# Patient Record
Sex: Female | Born: 1955 | Race: Black or African American | Hispanic: No | Marital: Single | Smoking: Never smoker
Health system: Southern US, Community
[De-identification: ages and names within clinical notes are randomized; demographics above are authoritative.]

## PROBLEM LIST (undated history)

## (undated) DIAGNOSIS — I10 Essential (primary) hypertension: Secondary | ICD-10-CM

## (undated) DIAGNOSIS — G44209 Tension-type headache, unspecified, not intractable: Secondary | ICD-10-CM

## (undated) DIAGNOSIS — G43909 Migraine, unspecified, not intractable, without status migrainosus: Secondary | ICD-10-CM

## (undated) DIAGNOSIS — D649 Anemia, unspecified: Secondary | ICD-10-CM

## (undated) DIAGNOSIS — C801 Malignant (primary) neoplasm, unspecified: Secondary | ICD-10-CM

## (undated) DIAGNOSIS — C189 Malignant neoplasm of colon, unspecified: Secondary | ICD-10-CM

## (undated) DIAGNOSIS — K219 Gastro-esophageal reflux disease without esophagitis: Secondary | ICD-10-CM

## (undated) HISTORY — PX: ABDOMINAL HYSTERECTOMY: SHX81

## (undated) HISTORY — PX: COLON SURGERY: SHX602

## (undated) HISTORY — PX: CHOLECYSTECTOMY: SHX55

---

## 2008-11-25 ENCOUNTER — Emergency Department (HOSPITAL_BASED_OUTPATIENT_CLINIC_OR_DEPARTMENT_OTHER): Admission: EM | Admit: 2008-11-25 | Discharge: 2008-11-25 | Payer: Self-pay | Admitting: Emergency Medicine

## 2008-11-27 ENCOUNTER — Emergency Department (HOSPITAL_BASED_OUTPATIENT_CLINIC_OR_DEPARTMENT_OTHER): Admission: EM | Admit: 2008-11-27 | Discharge: 2008-11-27 | Payer: Self-pay | Admitting: Emergency Medicine

## 2008-11-27 ENCOUNTER — Ambulatory Visit: Payer: Self-pay | Admitting: Radiology

## 2008-12-04 ENCOUNTER — Emergency Department (HOSPITAL_BASED_OUTPATIENT_CLINIC_OR_DEPARTMENT_OTHER): Admission: EM | Admit: 2008-12-04 | Discharge: 2008-12-04 | Payer: Self-pay | Admitting: Emergency Medicine

## 2008-12-23 ENCOUNTER — Ambulatory Visit: Payer: Self-pay | Admitting: Radiology

## 2008-12-23 ENCOUNTER — Emergency Department (HOSPITAL_BASED_OUTPATIENT_CLINIC_OR_DEPARTMENT_OTHER): Admission: EM | Admit: 2008-12-23 | Discharge: 2008-12-23 | Payer: Self-pay | Admitting: Emergency Medicine

## 2009-05-18 ENCOUNTER — Emergency Department (HOSPITAL_BASED_OUTPATIENT_CLINIC_OR_DEPARTMENT_OTHER): Admission: EM | Admit: 2009-05-18 | Discharge: 2009-05-18 | Payer: Self-pay | Admitting: Emergency Medicine

## 2009-06-14 ENCOUNTER — Emergency Department (HOSPITAL_BASED_OUTPATIENT_CLINIC_OR_DEPARTMENT_OTHER): Admission: EM | Admit: 2009-06-14 | Discharge: 2009-06-14 | Payer: Self-pay | Admitting: Emergency Medicine

## 2009-06-22 ENCOUNTER — Emergency Department (HOSPITAL_BASED_OUTPATIENT_CLINIC_OR_DEPARTMENT_OTHER): Admission: EM | Admit: 2009-06-22 | Discharge: 2009-06-22 | Payer: Self-pay | Admitting: Emergency Medicine

## 2009-08-16 ENCOUNTER — Emergency Department (HOSPITAL_BASED_OUTPATIENT_CLINIC_OR_DEPARTMENT_OTHER): Admission: EM | Admit: 2009-08-16 | Discharge: 2009-08-16 | Payer: Self-pay | Admitting: Emergency Medicine

## 2009-09-11 ENCOUNTER — Emergency Department (HOSPITAL_BASED_OUTPATIENT_CLINIC_OR_DEPARTMENT_OTHER): Admission: EM | Admit: 2009-09-11 | Discharge: 2009-09-11 | Payer: Self-pay | Admitting: Emergency Medicine

## 2010-01-13 ENCOUNTER — Emergency Department (HOSPITAL_BASED_OUTPATIENT_CLINIC_OR_DEPARTMENT_OTHER): Admission: EM | Admit: 2010-01-13 | Discharge: 2010-01-13 | Payer: Self-pay | Admitting: Emergency Medicine

## 2010-03-06 ENCOUNTER — Emergency Department (HOSPITAL_BASED_OUTPATIENT_CLINIC_OR_DEPARTMENT_OTHER)
Admission: EM | Admit: 2010-03-06 | Discharge: 2010-03-06 | Payer: Self-pay | Source: Home / Self Care | Admitting: Emergency Medicine

## 2010-04-14 ENCOUNTER — Emergency Department (HOSPITAL_BASED_OUTPATIENT_CLINIC_OR_DEPARTMENT_OTHER)
Admission: EM | Admit: 2010-04-14 | Discharge: 2010-04-14 | Disposition: A | Discharge status: Home / Self Care | Payer: Self-pay | Attending: Emergency Medicine | Admitting: Emergency Medicine

## 2010-04-14 DIAGNOSIS — G43909 Migraine, unspecified, not intractable, without status migrainosus: Secondary | ICD-10-CM | POA: Insufficient documentation

## 2010-04-14 DIAGNOSIS — I10 Essential (primary) hypertension: Secondary | ICD-10-CM | POA: Insufficient documentation

## 2010-06-10 LAB — BASIC METABOLIC PANEL
CO2: 25 mEq/L (ref 19–32)
Chloride: 106 mEq/L (ref 96–112)
Creatinine, Ser: 0.6 mg/dL (ref 0.4–1.2)
Potassium: 3.7 mEq/L (ref 3.5–5.1)
Sodium: 142 mEq/L (ref 135–145)

## 2010-06-10 LAB — DIFFERENTIAL
Basophils Absolute: 0.2 10*3/uL — ABNORMAL HIGH (ref 0.0–0.1)
Eosinophils Absolute: 0 10*3/uL (ref 0.0–0.7)
Monocytes Relative: 7 % (ref 3–12)
Neutro Abs: 5.4 10*3/uL (ref 1.7–7.7)

## 2010-06-10 LAB — CBC
HCT: 41.9 % (ref 36.0–46.0)
MCHC: 32.7 g/dL (ref 30.0–36.0)
MCV: 82.4 fL (ref 78.0–100.0)

## 2010-06-10 LAB — URINALYSIS, ROUTINE W REFLEX MICROSCOPIC
Bilirubin Urine: NEGATIVE
Hgb urine dipstick: NEGATIVE
Nitrite: NEGATIVE
Urobilinogen, UA: 0.2 mg/dL (ref 0.0–1.0)
pH: 5.5 (ref 5.0–8.0)

## 2010-06-10 LAB — URINE CULTURE: Colony Count: 15000

## 2010-06-10 LAB — PROTIME-INR: Prothrombin Time: 12.6 seconds (ref 11.6–15.2)

## 2010-06-11 LAB — DIFFERENTIAL
Basophils Absolute: 0 10*3/uL (ref 0.0–0.1)
Basophils Relative: 0 % (ref 0–1)
Eosinophils Absolute: 0.1 10*3/uL (ref 0.0–0.7)
Eosinophils Relative: 2 % (ref 0–5)
Lymphocytes Relative: 13 % (ref 12–46)
Lymphs Abs: 1 10*3/uL (ref 0.7–4.0)
Monocytes Absolute: 0.5 10*3/uL (ref 0.1–1.0)
Monocytes Relative: 6 % (ref 3–12)
Neutrophils Relative %: 79 % — ABNORMAL HIGH (ref 43–77)

## 2010-06-11 LAB — POCT CARDIAC MARKERS: CKMB, poc: <1 ng/mL — ABNORMAL LOW (ref 1.0–8.0)

## 2010-06-11 LAB — CBC
HCT: 41.8 % (ref 36.0–46.0)
Hemoglobin: 13.8 g/dL (ref 12.0–15.0)
Platelets: 276 10*3/uL (ref 150–400)
RBC: 5.09 MIL/uL (ref 3.87–5.11)

## 2010-06-11 LAB — BASIC METABOLIC PANEL
Calcium: 9.2 mg/dL (ref 8.4–10.5)
Chloride: 107 mEq/L (ref 96–112)
GFR calc Af Amer: >60 mL/min (ref >60)
Glucose, Bld: 106 mg/dL — ABNORMAL HIGH (ref 70–99)
Sodium: 144 mEq/L (ref 135–145)

## 2010-06-28 ENCOUNTER — Emergency Department (HOSPITAL_BASED_OUTPATIENT_CLINIC_OR_DEPARTMENT_OTHER)
Admission: EM | Admit: 2010-06-28 | Discharge: 2010-06-28 | Disposition: A | Discharge status: Home / Self Care | Payer: Self-pay | Attending: Emergency Medicine | Admitting: Emergency Medicine

## 2010-06-28 DIAGNOSIS — Z79899 Other long term (current) drug therapy: Secondary | ICD-10-CM | POA: Insufficient documentation

## 2010-06-28 DIAGNOSIS — I10 Essential (primary) hypertension: Secondary | ICD-10-CM | POA: Insufficient documentation

## 2010-06-28 DIAGNOSIS — K219 Gastro-esophageal reflux disease without esophagitis: Secondary | ICD-10-CM | POA: Insufficient documentation

## 2010-06-28 DIAGNOSIS — G43909 Migraine, unspecified, not intractable, without status migrainosus: Secondary | ICD-10-CM | POA: Insufficient documentation

## 2010-09-11 ENCOUNTER — Encounter: Payer: Self-pay | Admitting: Emergency Medicine

## 2010-09-11 ENCOUNTER — Emergency Department (HOSPITAL_BASED_OUTPATIENT_CLINIC_OR_DEPARTMENT_OTHER)
Admission: EM | Admit: 2010-09-11 | Discharge: 2010-09-11 | Disposition: A | Discharge status: Home / Self Care | Payer: Self-pay | Attending: Emergency Medicine | Admitting: Emergency Medicine

## 2010-09-11 DIAGNOSIS — I10 Essential (primary) hypertension: Secondary | ICD-10-CM | POA: Insufficient documentation

## 2010-09-11 DIAGNOSIS — K219 Gastro-esophageal reflux disease without esophagitis: Secondary | ICD-10-CM | POA: Insufficient documentation

## 2010-09-11 DIAGNOSIS — R51 Headache: Secondary | ICD-10-CM | POA: Insufficient documentation

## 2010-09-11 HISTORY — DX: Essential (primary) hypertension: I10

## 2010-09-11 HISTORY — DX: Gastro-esophageal reflux disease without esophagitis: K21.9

## 2010-09-11 HISTORY — DX: Anemia, unspecified: D64.9

## 2010-09-11 HISTORY — DX: Malignant (primary) neoplasm, unspecified: C80.1

## 2010-09-11 MED ORDER — HYDROMORPHONE HCL 2 MG/ML IJ SOLN
INTRAMUSCULAR | Status: AC
  Filled 2010-09-11: qty 1

## 2010-09-11 MED ORDER — PROMETHAZINE HCL 25 MG/ML IJ SOLN
INTRAMUSCULAR | Status: AC
  Filled 2010-09-11: qty 1

## 2010-09-11 MED ORDER — PROMETHAZINE HCL 25 MG/ML IJ SOLN: 12.5000 mg | Freq: Once | INTRAMUSCULAR | Status: DC

## 2010-09-11 MED ORDER — HYDROMORPHONE HCL 2 MG/ML IJ SOLN: 2.0000 mg | Freq: Once | INTRAMUSCULAR | Status: DC

## 2010-09-11 NOTE — ED Notes (Signed)
Headache started x 3 days with nausea  Reports having hx of same...ambulatory....moves all extremeties

## 2010-09-11 NOTE — ED Provider Notes (Signed)
History     Chief Complaint  Patient presents with  . Migraine   Patient is a 55 y.o. female presenting with migraine. The history is provided by the patient.  Migraine This is a new problem. The current episode started yesterday. The problem occurs constantly. The problem has been unchanged. Associated symptoms include headaches and nausea. The symptoms are aggravated by nothing. She has tried acetaminophen for the symptoms. The treatment provided no relief.    Past Medical History  Diagnosis Date  . Cancer   . Hypertension   . GERD (gastroesophageal reflux disease)   . Anemia     Past Surgical History  Procedure Date  . Cholecystectomy   . Abdominal hysterectomy   . Colon surgery     No family history on file.  History  Substance Use Topics  . Smoking status: Not on file  . Smokeless tobacco: Not on file  . Alcohol Use:     OB History    Grav Para Term Preterm Abortions TAB SAB Ect Mult Living                  Review of Systems  Gastrointestinal: Positive for nausea.  Neurological: Positive for headaches.  All other systems reviewed and are negative.    Physical Exam  BP 147/79  Pulse 97  Temp(Src) 98.1 F (36.7 C) (Oral)  Resp 20  SpO2 100%  Physical Exam  Constitutional: She is oriented to person, place, and time. She appears well-developed.  HENT:  Head: Normocephalic.  Right Ear: External ear normal.  Left Ear: External ear normal.  Mouth/Throat: Oropharynx is clear and moist.  Eyes: Conjunctivae and EOM are normal. Pupils are equal, round, and reactive to light.  Neck: Normal range of motion. Neck supple.  Cardiovascular: Normal rate, regular rhythm and normal heart sounds.   Pulmonary/Chest: Effort normal.  Abdominal: Soft.  Musculoskeletal: Normal range of motion.  Neurological: She is alert and oriented to person, place, and time. No cranial nerve deficit.  Skin: Skin is warm and dry.  Psychiatric: She has a normal mood and affect.     ED Course  Procedures   Pt has a history of colon cancer,  Pt is having a colonoscopy on Monday.  (Pt reports Dilaudid and Phenergan have worked well in the past)  I advised I would like to try other medications however given pending scope I will treat with Dilaudid and Phenergan)    Langston Masker, PA 09/11/10 1255  Langston Masker, [place] 09/11/10 1257  Langston Masker, [place] 09/11/10 1324   I performed a history and physical examination of Kallie Depolo and discussed her management with Langston Masker.  I agree with the history, physical, assessment, and plan of care, with the following exceptions: None  I was present for the following procedures: None Time Spent in Critical Care of the patient: None Time spent in discussions with the patient and family  Shania Bjelland Corlis Leak, MD 09/11/10 1329

## 2010-09-12 ENCOUNTER — Encounter (HOSPITAL_BASED_OUTPATIENT_CLINIC_OR_DEPARTMENT_OTHER): Payer: Self-pay | Admitting: Emergency Medicine

## 2010-10-25 ENCOUNTER — Encounter (HOSPITAL_BASED_OUTPATIENT_CLINIC_OR_DEPARTMENT_OTHER): Payer: Self-pay | Admitting: *Deleted

## 2010-10-25 ENCOUNTER — Emergency Department (HOSPITAL_BASED_OUTPATIENT_CLINIC_OR_DEPARTMENT_OTHER)
Admission: EM | Admit: 2010-10-25 | Discharge: 2010-10-25 | Disposition: A | Discharge status: Home / Self Care | Payer: Self-pay | Attending: Emergency Medicine | Admitting: Emergency Medicine

## 2010-10-25 DIAGNOSIS — R51 Headache: Secondary | ICD-10-CM | POA: Insufficient documentation

## 2010-10-25 DIAGNOSIS — R110 Nausea: Secondary | ICD-10-CM | POA: Insufficient documentation

## 2010-10-25 DIAGNOSIS — I10 Essential (primary) hypertension: Secondary | ICD-10-CM | POA: Insufficient documentation

## 2010-10-25 DIAGNOSIS — K219 Gastro-esophageal reflux disease without esophagitis: Secondary | ICD-10-CM | POA: Insufficient documentation

## 2010-10-25 MED ORDER — HYDROMORPHONE HCL 1 MG/ML IJ SOLN
INTRAMUSCULAR | Status: AC
  Filled 2010-10-25: qty 1

## 2010-10-25 MED ORDER — IBUPROFEN 800 MG PO TABS: 800.0000 mg | ORAL_TABLET | Freq: Once | ORAL | Status: DC

## 2010-10-25 MED ORDER — DIPHENHYDRAMINE HCL 50 MG/ML IJ SOLN
25.0000 mg | Freq: Once | INTRAMUSCULAR | Status: AC
  Administered 2010-10-25: 25 mg via INTRAVENOUS
  Filled 2010-10-25: qty 1

## 2010-10-25 MED ORDER — HYDROMORPHONE HCL 1 MG/ML IJ SOLN
2.0000 mg | Freq: Once | INTRAMUSCULAR | Status: AC
  Administered 2010-10-25: 2 mg via INTRAVENOUS
  Filled 2010-10-25: qty 1

## 2010-10-25 MED ORDER — PROMETHAZINE HCL 25 MG/ML IJ SOLN
12.5000 mg | Freq: Once | INTRAMUSCULAR | Status: AC
  Administered 2010-10-25: 12.5 mg via INTRAVENOUS
  Filled 2010-10-25: qty 1

## 2010-10-25 MED ORDER — MORPHINE SULFATE 4 MG/ML IJ SOLN: 8.0000 mg | Freq: Once | INTRAMUSCULAR | Status: DC

## 2010-10-25 NOTE — ED Provider Notes (Signed)
History     CSN: [id number] Arrival date & time: 10/25/2010 11:37 AM  Chief Complaint  Patient presents with  . Headache   Patient is a 55 y.o. female presenting with headaches. The history is provided by the patient.  Headache  This is a recurrent problem. The current episode started yesterday. The problem occurs constantly. The problem has not changed since onset.The pain is located in the bilateral region. The quality of the pain is described as dull and throbbing. The pain is at a severity of 8/10. The pain is moderate. The pain does not radiate. Associated symptoms include nausea. Pertinent negatives include no fever, no chest pressure, no near-syncope, no orthopnea, no palpitations and no shortness of breath. She has tried nothing for the symptoms. The treatment provided moderate relief.  Pt complains of a headache since yesterday.  No relief with fiorcet  Past Medical History  Diagnosis Date  . Cancer   . Hypertension   . GERD (gastroesophageal reflux disease)   . Anemia   . GERD (gastroesophageal reflux disease)     Past Surgical History  Procedure Date  . Cholecystectomy   . Abdominal hysterectomy   . Colon surgery     History reviewed. No pertinent family history.  History  Substance Use Topics  . Smoking status: Never Smoker   . Smokeless tobacco: Not on file  . Alcohol Use: No    OB History    Grav Para Term Preterm Abortions TAB SAB Ect Mult Living                  Review of Systems  Constitutional: Negative for fever.  Respiratory: Negative for shortness of breath.   Cardiovascular: Negative for palpitations, orthopnea and near-syncope.  Gastrointestinal: Positive for nausea.  Neurological: Positive for headaches.  All other systems reviewed and are negative.    Physical Exam  BP 148/87  Pulse 92  Temp(Src) 98.1 F (36.7 C) (Oral)  Resp 18  Ht 5\' 3"  (1.6 m)  Wt 215 lb (97.523 kg)  BMI 38.09 kg/m2  SpO2 98%  Physical Exam  Nursing note  and vitals reviewed. Constitutional: She is oriented to person, place, and time. She appears well-developed and well-nourished.  HENT:  Head: Normocephalic and atraumatic.  Eyes: Conjunctivae and EOM are normal. Pupils are equal, round, and reactive to light.  Neck: Normal range of motion. Neck supple.  Cardiovascular: Normal rate and regular rhythm.   Pulmonary/Chest: Effort normal and breath sounds normal.  Abdominal: Soft.  Musculoskeletal: Normal range of motion.  Neurological: She is alert and oriented to person, place, and time. She has normal reflexes.  Skin: Skin is warm and dry.  Psychiatric: She has a normal mood and affect.    ED Course  Procedures  MDM  Pt given Dilaudid,  Benadryl and Phenergan.  Pt advised to followup with her Md as scheduled.      Langston Masker, [place] 10/25/10 1318

## 2010-10-25 NOTE — ED Notes (Signed)
Triage and assessment reviewed by myself and agree with [facility] RN findings.

## 2010-10-25 NOTE — ED Notes (Addendum)
Patient states she woke up Friday morning (3days ago) with a headache and it has not gone away and pain is words at times. Patient states she has had nausea and denies vomiting and fever.

## 2010-10-25 NOTE — ED Notes (Signed)
Correction to medication entry at 1302- Benadryl, Dilaudid and Phenergan ad ministered IM per order.

## 2010-10-25 NOTE — ED Provider Notes (Signed)
Medical screening examination/treatment/procedure(s) were performed by non-physician practitioner and as supervising physician I was immediately available for consultation/collaboration.   Lyanne Co, MD 10/25/10 1318

## 2010-11-07 ENCOUNTER — Encounter (HOSPITAL_BASED_OUTPATIENT_CLINIC_OR_DEPARTMENT_OTHER): Payer: Self-pay | Admitting: *Deleted

## 2010-11-07 ENCOUNTER — Emergency Department (HOSPITAL_BASED_OUTPATIENT_CLINIC_OR_DEPARTMENT_OTHER)
Admission: EM | Admit: 2010-11-07 | Discharge: 2010-11-07 | Disposition: A | Discharge status: Home / Self Care | Payer: Self-pay | Attending: Emergency Medicine | Admitting: Emergency Medicine

## 2010-11-07 DIAGNOSIS — K219 Gastro-esophageal reflux disease without esophagitis: Secondary | ICD-10-CM | POA: Insufficient documentation

## 2010-11-07 DIAGNOSIS — R51 Headache: Secondary | ICD-10-CM | POA: Insufficient documentation

## 2010-11-07 MED ORDER — DIPHENHYDRAMINE HCL 50 MG/ML IJ SOLN
25.0000 mg | Freq: Once | INTRAMUSCULAR | Status: DC
  Filled 2010-11-07: qty 1

## 2010-11-07 MED ORDER — KETOROLAC TROMETHAMINE 60 MG/2ML IM SOLN
60.0000 mg | Freq: Once | INTRAMUSCULAR | Status: AC
  Administered 2010-11-07: 60 mg via INTRAMUSCULAR
  Filled 2010-11-07: qty 2

## 2010-11-07 MED ORDER — DIPHENHYDRAMINE HCL 50 MG/ML IJ SOLN
25.0000 mg | Freq: Once | INTRAMUSCULAR | Status: AC
  Administered 2010-11-07: 25 mg via INTRAMUSCULAR

## 2010-11-07 MED ORDER — METOCLOPRAMIDE HCL 5 MG/ML IJ SOLN
10.0000 mg | Freq: Once | INTRAMUSCULAR | Status: AC
  Administered 2010-11-07: 10 mg via INTRAMUSCULAR
  Filled 2010-11-07: qty 2

## 2010-11-07 NOTE — ED Notes (Signed)
Pt d/c home- no Rx given- pt alert, ambulatory with steady gait

## 2010-11-07 NOTE — ED Provider Notes (Signed)
History     CSN: [id number] Arrival date & time: 11/07/2010  1:27 PM  Chief Complaint  Patient presents with  . Headache   HPI Comments: Pt states that she has history of similar symptoms:pt states that she is usually gets dilaudid and phenergan  Patient is a 55 y.o. female presenting with headaches. The history is provided by the patient. No language interpreter was used.  Headache  This is a recurrent problem. The current episode started 2 days ago. The problem occurs constantly. The problem has not changed since onset.The pain is located in the frontal region. The quality of the pain is described as throbbing. The pain is severe. The pain does not radiate. Associated symptoms include nausea. Pertinent negatives include no fever, no near-syncope, no shortness of breath and no vomiting. She has tried acetaminophen for the symptoms. The treatment provided no relief.    Past Medical History  Diagnosis Date  . Cancer   . Hypertension   . GERD (gastroesophageal reflux disease)   . Anemia   . GERD (gastroesophageal reflux disease)     Past Surgical History  Procedure Date  . Cholecystectomy   . Abdominal hysterectomy   . Colon surgery     History reviewed. No pertinent family history.  History  Substance Use Topics  . Smoking status: Never Smoker   . Smokeless tobacco: Not on file  . Alcohol Use: No    OB History    Grav Para Term Preterm Abortions TAB SAB Ect Mult Living                  Review of Systems  Constitutional: Negative for fever.  Respiratory: Negative for shortness of breath.   Cardiovascular: Negative for near-syncope.  Gastrointestinal: Positive for nausea. Negative for vomiting.  Neurological: Positive for headaches.  All other systems reviewed and are negative.    Physical Exam  BP 201/109  Pulse 104  Temp(Src) 98.2 F (36.8 C) (Oral)  Resp 20  Ht 5\' 3"  (1.6 m)  Wt 214 lb (97.07 kg)  BMI 37.91 kg/m2  SpO2 100%  Physical Exam  Vitals  reviewed. Constitutional: She is oriented to person, place, and time. She appears well-developed and well-nourished.  HENT:  Head: Normocephalic and atraumatic.  Right Ear: External ear normal.  Left Ear: External ear normal.  Eyes: Pupils are equal, round, and reactive to light.  Cardiovascular: Normal rate and regular rhythm.   Pulmonary/Chest: Effort normal and breath sounds normal.  Neurological: She is alert and oriented to person, place, and time.  Skin: Skin is warm and dry.  Psychiatric: She has a normal mood and affect.    ED Course  Procedures  MDM 2:00 PM Discussed with pt that will give headache cocktail 3:12 PM Offered pt some imitrex or phenergan and pt is refusing, she would like dilaudid only or she can go home:discussed with pt that as discussed previously no narcotics to be given     Teressa Lower, NP 11/07/10 1513

## 2010-11-07 NOTE — ED Notes (Signed)
Pt states she was seen here two weeks ago for same. This particular headache started on Thursday. +nausea. Denies other s/s.

## 2010-11-09 NOTE — ED Provider Notes (Signed)
Medical screening examination/treatment/procedure(s) were performed by non-physician practitioner and as supervising physician I was immediately available for consultation/collaboration.   Nat Christen, MD 11/09/10 [phone]

## 2011-03-06 ENCOUNTER — Encounter (HOSPITAL_BASED_OUTPATIENT_CLINIC_OR_DEPARTMENT_OTHER): Payer: Self-pay

## 2011-03-06 ENCOUNTER — Emergency Department (HOSPITAL_BASED_OUTPATIENT_CLINIC_OR_DEPARTMENT_OTHER)
Admission: EM | Admit: 2011-03-06 | Discharge: 2011-03-06 | Disposition: A | Discharge status: Home / Self Care | Payer: Self-pay | Attending: Emergency Medicine | Admitting: Emergency Medicine

## 2011-03-06 DIAGNOSIS — I10 Essential (primary) hypertension: Secondary | ICD-10-CM | POA: Insufficient documentation

## 2011-03-06 DIAGNOSIS — G43909 Migraine, unspecified, not intractable, without status migrainosus: Secondary | ICD-10-CM | POA: Insufficient documentation

## 2011-03-06 DIAGNOSIS — K219 Gastro-esophageal reflux disease without esophagitis: Secondary | ICD-10-CM | POA: Insufficient documentation

## 2011-03-06 HISTORY — DX: Tension-type headache, unspecified, not intractable: G44.209

## 2011-03-06 MED ORDER — ONDANSETRON 8 MG PO TBDP
ORAL_TABLET | ORAL | Status: AC
  Administered 2011-03-06: 8 mg via ORAL
  Filled 2011-03-06: qty 1

## 2011-03-06 MED ORDER — MORPHINE SULFATE 2 MG/ML IJ SOLN
2.0000 mg | Freq: Once | INTRAMUSCULAR | Status: AC
  Administered 2011-03-06: 2 mg via INTRAMUSCULAR
  Filled 2011-03-06: qty 1

## 2011-03-06 MED ORDER — ONDANSETRON 8 MG PO TBDP
8.0000 mg | ORAL_TABLET | Freq: Once | ORAL | Status: AC
  Administered 2011-03-06: 8 mg via ORAL

## 2011-03-06 NOTE — ED Provider Notes (Signed)
History    This chart was scribed for Hilario Quarry, MD, MD by Smitty Pluck. The patient was seen in room MH03 and the patient's care was started at 4:22PM.   CSN: [id number]  Arrival date & time 03/06/11  1420   First MD Initiated Contact with Patient 03/06/11 1613      Chief Complaint  Patient presents with  . Headache    (Consider location/radiation/quality/duration/timing/severity/associated sxs/prior treatment) The history is provided by the patient.   Victoria Sullivan is a 55 y.o. female who presents to the Emergency Department complaining of moderate headache onset 5 days ago. Pain has been constant since onset. Pt has a hx of migraine headaches that start in the left side of head. Pt has nausea, fever and neck pain but denies vomiting today.  Pt has colon cancer (diagnosed 2 yrs ago). PCP is Dr. Debroah Loop. Pt has had abdominal hysterectomy and colon surgery.   Past Medical History  Diagnosis Date  . Cancer   . Hypertension   . GERD (gastroesophageal reflux disease)   . Anemia   . GERD (gastroesophageal reflux disease)   . Tension headache     Past Surgical History  Procedure Date  . Cholecystectomy   . Abdominal hysterectomy   . Colon surgery     No family history on file.  History  Substance Use Topics  . Smoking status: Never Smoker   . Smokeless tobacco: Not on file  . Alcohol Use: No    OB History    Grav Para Term Preterm Abortions TAB SAB Ect Mult Living                  Review of Systems  All other systems reviewed and are negative.   10 Systems reviewed and are negative for acute change except as noted in the HPI.  Allergies  Nsaids  Home Medications   Current Outpatient Rx  Name Route Sig Dispense Refill  . BUTALBITAL-APAP-CAFFEINE 50-325-40 MG PO TABS Oral Take 2 tablets by mouth 2 (two) times daily as needed. For migraines    . ESOMEPRAZOLE MAGNESIUM 40 MG PO CPDR Oral Take 40 mg by mouth daily before breakfast.      . FERROUS  SULFATE 325 (65 FE) MG PO TABS Oral Take 325 mg by mouth 3 (three) times daily.     . NEBIVOLOL HCL 20 MG PO TABS Oral Take 1 tablet by mouth daily.     Marland Kitchen OLMESARTAN-AMLODIPINE-HCTZ 40-10-25 MG PO TABS Oral Take 1 tablet by mouth daily.       BP 123/69  Pulse 90  Temp(Src) 97.9 F (36.6 C) (Oral)  Resp 16  Ht 5\' 3"  (1.6 m)  Wt 218 lb (98.884 kg)  BMI 38.62 kg/m2  SpO2 98%  Physical Exam  Nursing note and vitals reviewed. Constitutional: She is oriented to person, place, and time. She appears well-developed and well-nourished. No distress.  HENT:  Head: Normocephalic and atraumatic.  Eyes: EOM are normal. Pupils are equal, round, and reactive to light.  Neck: Normal range of motion. Neck supple. No tracheal deviation present.  Cardiovascular: Normal rate, regular rhythm and normal heart sounds.   Pulmonary/Chest: Effort normal. No respiratory distress.  Abdominal: Soft. She exhibits no distension. There is no tenderness.  Musculoskeletal: Normal range of motion.  Neurological: She is alert and oriented to person, place, and time. She has normal reflexes.  Skin: Skin is warm and dry.  Psychiatric: She has a normal mood and affect. Her behavior  is normal.    ED Course  Procedures (including critical care time)  DIAGNOSTIC STUDIES: Oxygen Saturation is 98% on room air, normal by my interpretation.    COORDINATION OF CARE:    Labs Reviewed - No data to display No results found.   No diagnosis found.    MDM     I personally performed the services described in this documentation, which was scribed in my presence. The recorded information has been reviewed and considered.    Hilario Quarry, MD 03/08/11 [phone]

## 2011-03-06 NOTE — ED Notes (Signed)
HA started 12/25

## 2011-03-08 ENCOUNTER — Emergency Department (HOSPITAL_COMMUNITY)
Admission: EM | Admit: 2011-03-08 | Discharge: 2011-03-08 | Disposition: A | Discharge status: Home / Self Care | Payer: Self-pay | Attending: Emergency Medicine | Admitting: Emergency Medicine

## 2011-03-08 ENCOUNTER — Encounter (HOSPITAL_COMMUNITY): Payer: Self-pay | Admitting: Emergency Medicine

## 2011-03-08 DIAGNOSIS — Z859 Personal history of malignant neoplasm, unspecified: Secondary | ICD-10-CM | POA: Insufficient documentation

## 2011-03-08 DIAGNOSIS — R630 Anorexia: Secondary | ICD-10-CM | POA: Insufficient documentation

## 2011-03-08 DIAGNOSIS — F430 Acute stress reaction: Secondary | ICD-10-CM | POA: Insufficient documentation

## 2011-03-08 DIAGNOSIS — H53149 Visual discomfort, unspecified: Secondary | ICD-10-CM | POA: Insufficient documentation

## 2011-03-08 DIAGNOSIS — Z79899 Other long term (current) drug therapy: Secondary | ICD-10-CM | POA: Insufficient documentation

## 2011-03-08 DIAGNOSIS — I10 Essential (primary) hypertension: Secondary | ICD-10-CM | POA: Insufficient documentation

## 2011-03-08 DIAGNOSIS — G43909 Migraine, unspecified, not intractable, without status migrainosus: Secondary | ICD-10-CM | POA: Insufficient documentation

## 2011-03-08 DIAGNOSIS — R110 Nausea: Secondary | ICD-10-CM | POA: Insufficient documentation

## 2011-03-08 DIAGNOSIS — K219 Gastro-esophageal reflux disease without esophagitis: Secondary | ICD-10-CM | POA: Insufficient documentation

## 2011-03-08 MED ORDER — PROMETHAZINE HCL 25 MG/ML IJ SOLN
25.0000 mg | Freq: Once | INTRAMUSCULAR | Status: AC
  Administered 2011-03-08: 25 mg via INTRAMUSCULAR
  Filled 2011-03-08: qty 1

## 2011-03-08 MED ORDER — HYDROMORPHONE HCL PF 2 MG/ML IJ SOLN
2.0000 mg | Freq: Once | INTRAMUSCULAR | Status: AC
  Administered 2011-03-08: 2 mg via INTRAMUSCULAR
  Filled 2011-03-08: qty 1

## 2011-03-08 NOTE — ED Provider Notes (Signed)
History     CSN: [id number]  Arrival date & time 03/08/11  1458   First MD Initiated Contact with Patient 03/08/11 1700      Chief Complaint  Patient presents with  . Headache    (Consider location/radiation/quality/duration/timing/severity/associated sxs/prior treatment) Patient is a 56 y.o. female presenting with headaches. The history is provided by the patient.  Headache  This is a chronic problem. The current episode started more than 2 days ago. The problem occurs constantly. The problem has not changed since onset.The headache is associated with bright light, emotional stress and loud noise. The pain is located in the left unilateral region. The quality of the pain is described as sharp and throbbing. The pain is at a severity of 9/10. The pain is moderate. The pain does not radiate. Associated symptoms include anorexia and nausea. Pertinent negatives include no fever, no chest pressure, no shortness of breath and no vomiting. She has tried NSAIDs and acetaminophen for the symptoms. The treatment provided no relief.    Past Medical History  Diagnosis Date  . Cancer   . Hypertension   . GERD (gastroesophageal reflux disease)   . Anemia   . GERD (gastroesophageal reflux disease)   . Tension headache     Past Surgical History  Procedure Date  . Cholecystectomy   . Abdominal hysterectomy   . Colon surgery     History reviewed. No pertinent family history.  History  Substance Use Topics  . Smoking status: Never Smoker   . Smokeless tobacco: Not on file  . Alcohol Use: No    OB History    Grav Para Term Preterm Abortions TAB SAB Ect Mult Living                  Review of Systems  Constitutional: Negative for fever.  Respiratory: Negative for shortness of breath.   Gastrointestinal: Positive for nausea and anorexia. Negative for vomiting.  Neurological: Positive for headaches.  All other systems reviewed and are negative.    Allergies  Nsaids  Home  Medications   Current Outpatient Rx  Name Route Sig Dispense Refill  . BUTALBITAL-APAP-CAFFEINE 50-325-40 MG PO TABS Oral Take 2 tablets by mouth 2 (two) times daily as needed. For migraines    . ESOMEPRAZOLE MAGNESIUM 40 MG PO CPDR Oral Take 40 mg by mouth daily before breakfast.      . FERROUS SULFATE 325 (65 FE) MG PO TABS Oral Take 325 mg by mouth 3 (three) times daily.     . NEBIVOLOL HCL 20 MG PO TABS Oral Take 1 tablet by mouth daily.     Marland Kitchen OLMESARTAN-AMLODIPINE-HCTZ 40-10-25 MG PO TABS Oral Take 1 tablet by mouth daily.       BP 154/67  Pulse 92  Temp(Src) 99 F (37.2 C) (Oral)  Resp 19  SpO2 100%  Physical Exam  Nursing note and vitals reviewed. Constitutional: She is oriented to person, place, and time. She appears well-developed and well-nourished. No distress.  HENT:  Head: Normocephalic and atraumatic.  Eyes: EOM are normal. Pupils are equal, round, and reactive to light.  Fundoscopic exam:      The right eye shows no papilledema.       The left eye shows no papilledema.  Neck: Normal range of motion. Neck supple.  Cardiovascular: Normal rate, regular rhythm, normal heart sounds and intact distal pulses.  Exam reveals no friction rub.   No murmur heard. Pulmonary/Chest: Effort normal and breath sounds normal. She has  no wheezes. She has no rales.  Abdominal: Soft. Bowel sounds are normal. She exhibits no distension. There is no tenderness. There is no rebound and no guarding.  Musculoskeletal: Normal range of motion. She exhibits no tenderness.       No edema  Lymphadenopathy:    She has no cervical adenopathy.  Neurological: She is alert and oriented to person, place, and time. She has normal strength. No cranial nerve deficit or sensory deficit. Gait normal.       photophobia  Skin: Skin is warm and dry. No rash noted.  Psychiatric: She has a normal mood and affect. Her behavior is normal.    ED Course  Procedures (including critical care time)  Labs  Reviewed - No data to display No results found.   No diagnosis found.    MDM   Pt with typical migraine HA without sx suggestive of SAH(sudden onset, worst of life, or deficits), infection, or cavernous vein thrombosis.  Normal neuro exam and vital signs. She states this is exactly like her normal migraines. She was seen on the 30th and given medication but she said the headache never went away. Patient given IM medication and discharged home.         Gwyneth Sprout, MD 03/08/11 [phone]

## 2011-03-08 NOTE — ED Notes (Signed)
Pt c/o frontal HA x 3 days; pt sts hx of migraine which are similar with nausea

## 2011-03-20 ENCOUNTER — Emergency Department (HOSPITAL_COMMUNITY)
Admission: EM | Admit: 2011-03-20 | Discharge: 2011-03-20 | Disposition: A | Discharge status: Home / Self Care | Payer: Self-pay | Attending: Emergency Medicine | Admitting: Emergency Medicine

## 2011-03-20 ENCOUNTER — Encounter (HOSPITAL_COMMUNITY): Payer: Self-pay | Admitting: *Deleted

## 2011-03-20 DIAGNOSIS — R110 Nausea: Secondary | ICD-10-CM | POA: Insufficient documentation

## 2011-03-20 DIAGNOSIS — Z79899 Other long term (current) drug therapy: Secondary | ICD-10-CM | POA: Insufficient documentation

## 2011-03-20 DIAGNOSIS — I10 Essential (primary) hypertension: Secondary | ICD-10-CM | POA: Insufficient documentation

## 2011-03-20 DIAGNOSIS — R51 Headache: Secondary | ICD-10-CM | POA: Insufficient documentation

## 2011-03-20 DIAGNOSIS — K219 Gastro-esophageal reflux disease without esophagitis: Secondary | ICD-10-CM | POA: Insufficient documentation

## 2011-03-20 MED ORDER — HYDROMORPHONE HCL PF 2 MG/ML IJ SOLN
2.0000 mg | Freq: Once | INTRAMUSCULAR | Status: AC
  Administered 2011-03-20: 2 mg via INTRAMUSCULAR
  Filled 2011-03-20: qty 1

## 2011-03-20 MED ORDER — PROMETHAZINE HCL 25 MG PO TABS
25.0000 mg | ORAL_TABLET | Freq: Once | ORAL | Status: AC
  Administered 2011-03-20: 25 mg via ORAL
  Filled 2011-03-20: qty 1

## 2011-03-20 MED ORDER — PROMETHAZINE HCL 25 MG PO TABS: 25.0000 mg | ORAL_TABLET | Freq: Four times a day (QID) | ORAL | Status: DC | PRN

## 2011-03-20 MED ORDER — HYDROCODONE-ACETAMINOPHEN 5-325 MG PO TABS: 1.0000 | ORAL_TABLET | ORAL | Status: AC | PRN

## 2011-03-20 NOTE — ED Provider Notes (Signed)
History     CSN: [id number]  Arrival date & time 03/20/11  1316   First MD Initiated Contact with Patient 03/20/11 1442      Chief Complaint  Patient presents with  . Headache    (Consider location/radiation/quality/duration/timing/severity/associated sxs/prior treatment) HPI... frontal headache radiating to left side of face for several days. Feels like her normal headache. Husband is sick in the hospital and she thinks this is contributing to her pain. No stiff neck or neurological deficits. Some nausea. Nothing makes it better or worse. Described as moderate.  No radiation  Past Medical History  Diagnosis Date  . Cancer   . Hypertension   . GERD (gastroesophageal reflux disease)   . Anemia   . GERD (gastroesophageal reflux disease)   . Tension headache     Past Surgical History  Procedure Date  . Cholecystectomy   . Abdominal hysterectomy   . Colon surgery     History reviewed. No pertinent family history.  History  Substance Use Topics  . Smoking status: Never Smoker   . Smokeless tobacco: Not on file  . Alcohol Use: No    OB History    Grav Para Term Preterm Abortions TAB SAB Ect Mult Living                  Review of Systems  All other systems reviewed and are negative.    Allergies  Nsaids  Home Medications   Current Outpatient Rx  Name Route Sig Dispense Refill  . BUTALBITAL-APAP-CAFFEINE 50-325-40 MG PO TABS Oral Take 2 tablets by mouth 2 (two) times daily as needed. For migraines    . ESOMEPRAZOLE MAGNESIUM 40 MG PO CPDR Oral Take 40 mg by mouth daily before breakfast.      . FERROUS SULFATE 325 (65 FE) MG PO TABS Oral Take 325 mg by mouth 3 (three) times daily.     . NEBIVOLOL HCL 20 MG PO TABS Oral Take 2 tablets by mouth daily.     Marland Kitchen OLMESARTAN-AMLODIPINE-HCTZ 40-10-25 MG PO TABS Oral Take 1 tablet by mouth daily.     Marland Kitchen HYDROCODONE-ACETAMINOPHEN 5-325 MG PO TABS Oral Take 1-2 tablets by mouth every 4 (four) hours as needed for pain. 15  tablet 0  . PROMETHAZINE HCL 25 MG PO TABS Oral Take 1 tablet (25 mg total) by mouth every 6 (six) hours as needed for nausea. 10 tablet 0    BP 143/80  Pulse 92  Temp(Src) 97.5 F (36.4 C) (Oral)  Resp 19  SpO2 99%  Physical Exam  Nursing note and vitals reviewed. Constitutional: She is oriented to person, place, and time. She appears well-developed and well-nourished.  HENT:  Head: Normocephalic and atraumatic.  Eyes: Conjunctivae and EOM are normal. Pupils are equal, round, and reactive to light.  Neck: Normal range of motion. Neck supple.  Cardiovascular: Normal rate and regular rhythm.   Pulmonary/Chest: Effort normal and breath sounds normal.  Abdominal: Soft. Bowel sounds are normal.  Musculoskeletal: Normal range of motion.  Neurological: She is alert and oriented to person, place, and time.  Skin: Skin is warm and dry.  Psychiatric: She has a normal mood and affect.    ED Course  Procedures (including critical care time)  Labs Reviewed - No data to display No results found.   1. Headache       MDM  Headache appears to be her normal. No neurological deficits. Will treat pain and nausea.  Donnetta Hutching, MD 03/20/11 [phone]

## 2011-03-20 NOTE — ED Notes (Signed)
MD at bedside. 

## 2011-03-20 NOTE — ED Notes (Signed)
Intermittent h/a for 10 days; here new years day for same sx; prescribed nausea and given shot for h/a. No motor and visual deficits.

## 2011-04-07 IMAGING — CT CT HEAD W/O CM
1 series · 16 of 30 positions shown, 20 images · non-contrast
Comparison: None

CLINICAL DATA: Headache. History of colon cancer.  Hypertension.

CT HEAD WITHOUT CONTRAST
TECHNIQUE: Contiguous axial images were obtained from the base of
the skull through the vertex without contrast

[Series 2: head 4.8 h37s · axial · 0.47mm/px · z∈[+1237,+1373]mm · 16 of 32 slices shown, 20 images]
[im 2/32  brain]
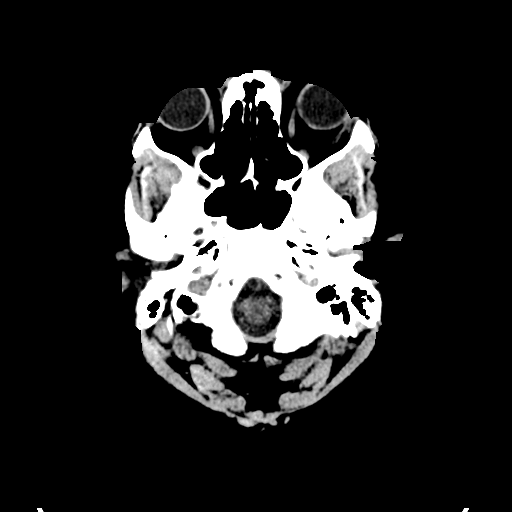
[im 2/32  bone]
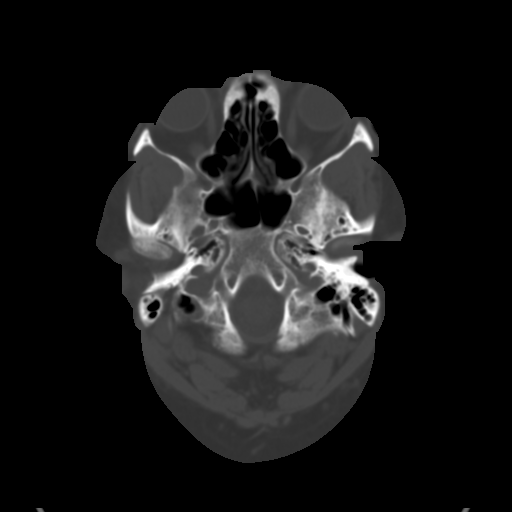
[im 4/32  brain]
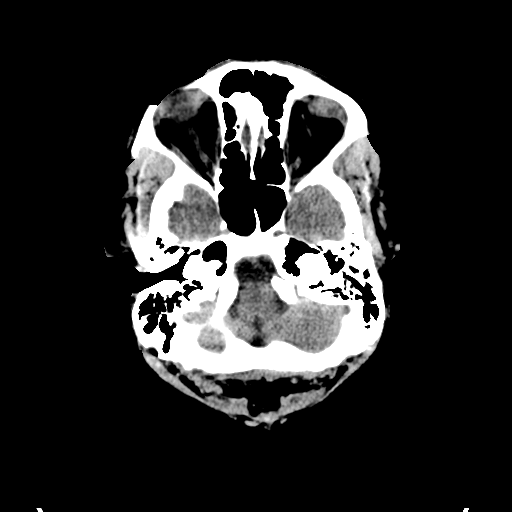
[im 6/32  brain]
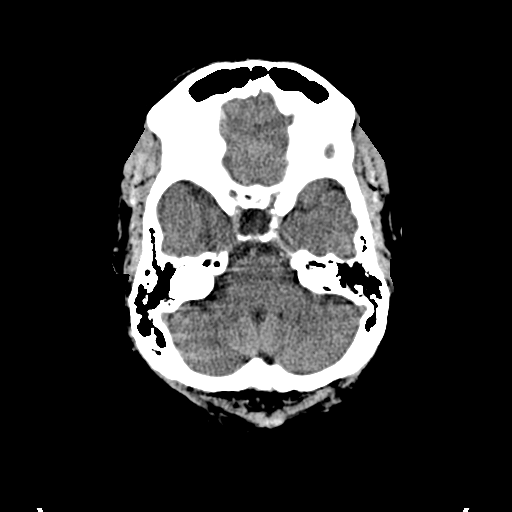
[im 8/32  brain]
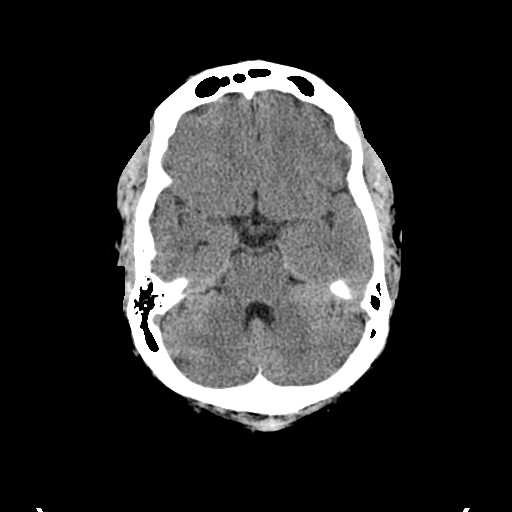
[im 9/32  brain]
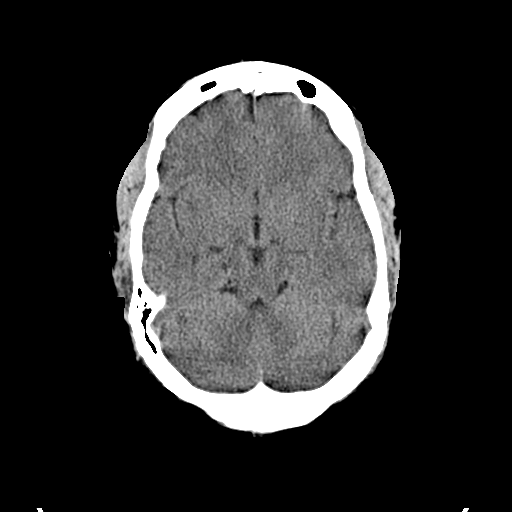
[im 9/32  bone]
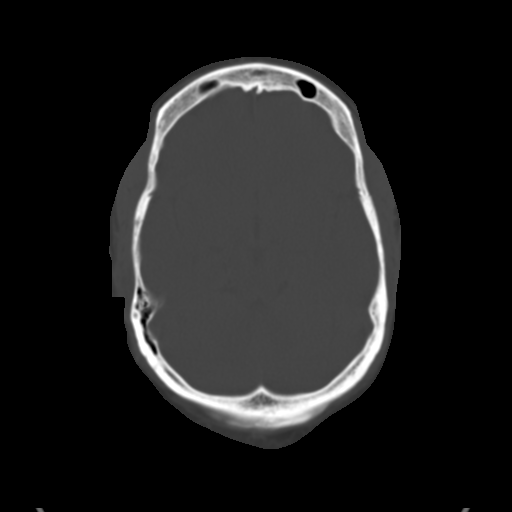
[im 11/32  brain]
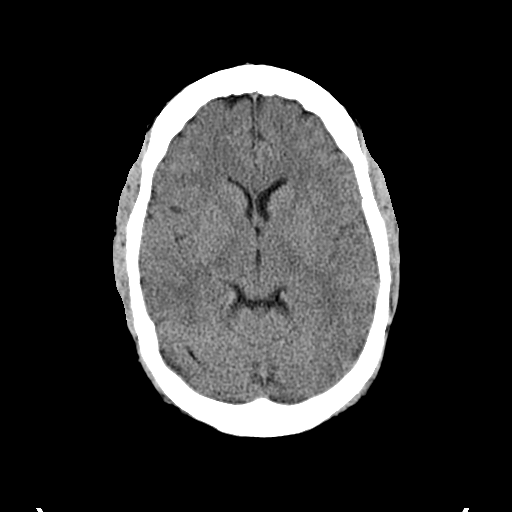
[im 13/32  brain]
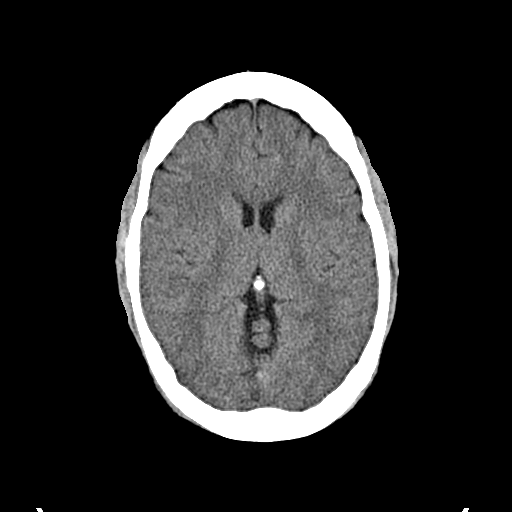
[im 15/32  brain]
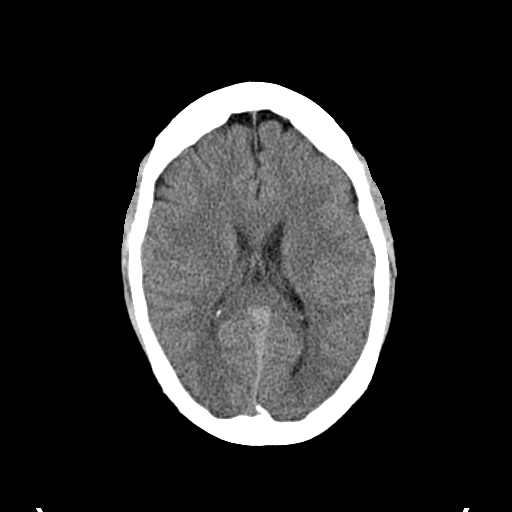
[im 17/32  brain]
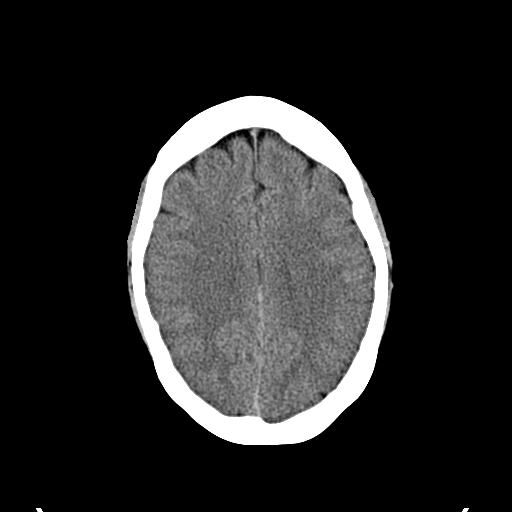
[im 17/32  bone]
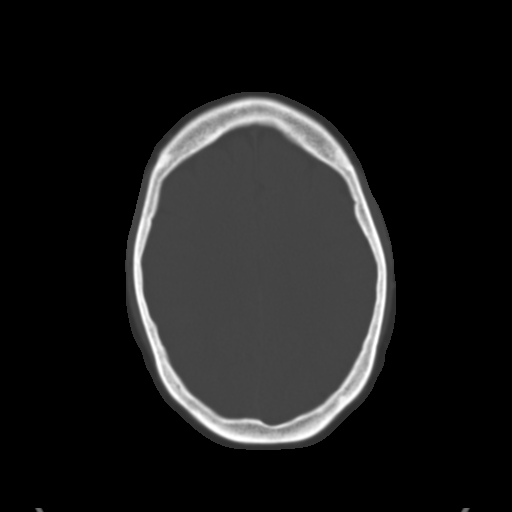
[im 19/32  brain]
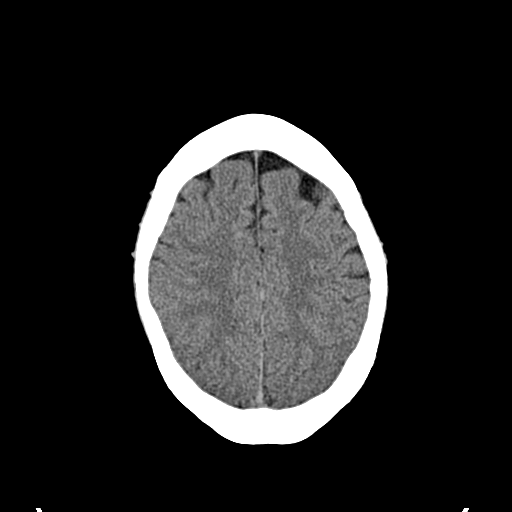
[im 21/32  brain]
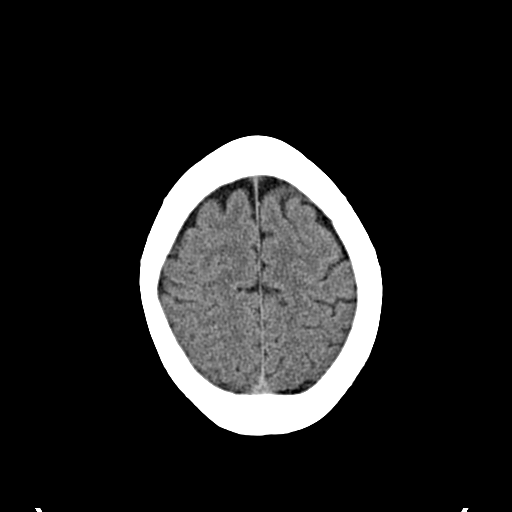
[im 23/32  brain]
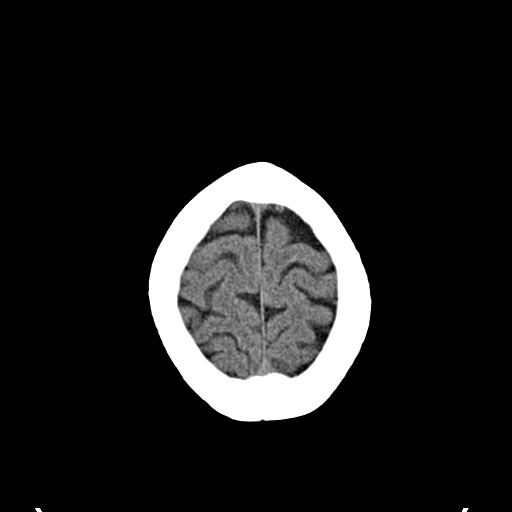
[im 24/32  brain]
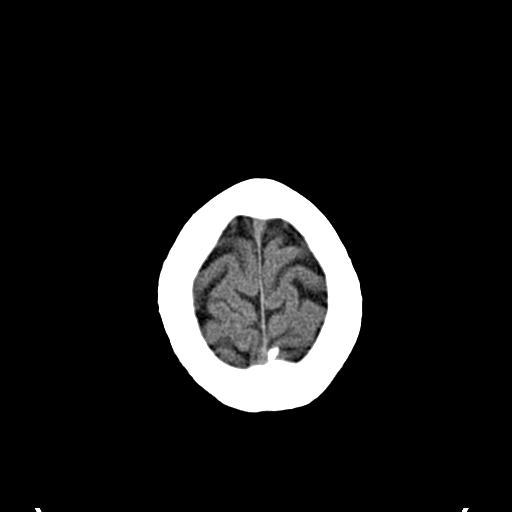
[im 24/32  bone]
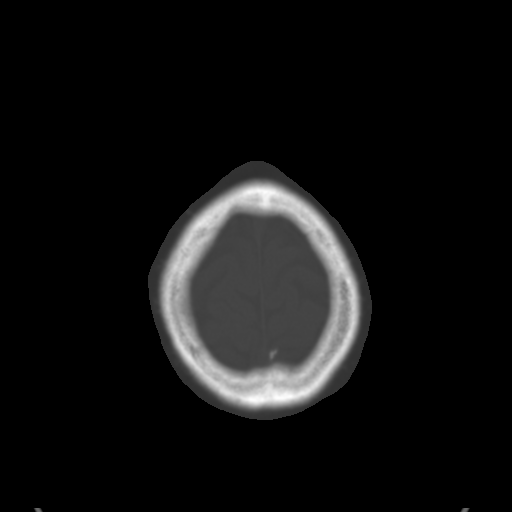
[im 26/32  brain]
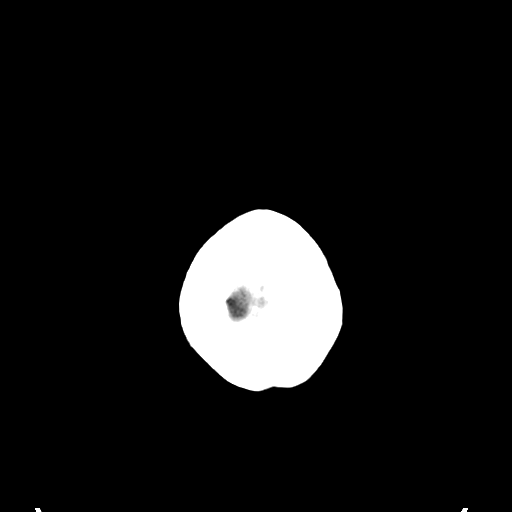
[im 28/32  brain]
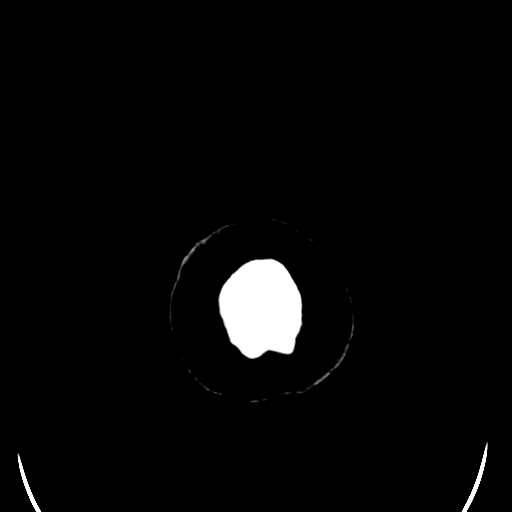
[im 30/32  brain]
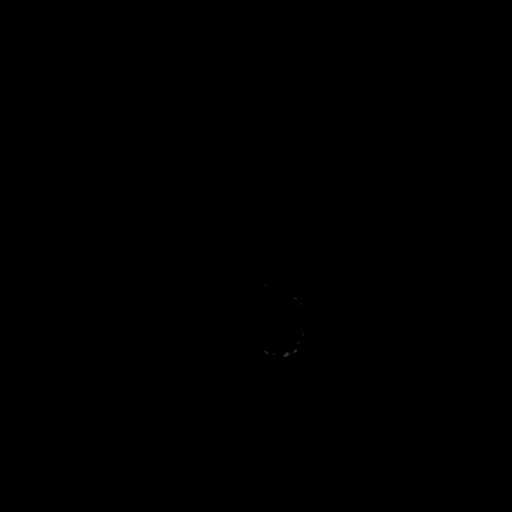

[16 of 30 positions shown; findings below may reference images not displayed]

FINDINGS: The brain has a normal appearance without evidence for
hemorrhage, acute infarction, hydrocephalus, or mass lesion.  There
is no extra axial fluid collection.  The skull and paranasal
sinuses are normal.
IMPRESSION: Normal CT of the head without contrast.

## 2011-05-16 ENCOUNTER — Encounter (HOSPITAL_BASED_OUTPATIENT_CLINIC_OR_DEPARTMENT_OTHER): Payer: Self-pay

## 2011-05-16 ENCOUNTER — Emergency Department (HOSPITAL_BASED_OUTPATIENT_CLINIC_OR_DEPARTMENT_OTHER)
Admission: EM | Admit: 2011-05-16 | Discharge: 2011-05-16 | Disposition: A | Discharge status: Home Health | Payer: Self-pay | Attending: Emergency Medicine | Admitting: Emergency Medicine

## 2011-05-16 DIAGNOSIS — Z85038 Personal history of other malignant neoplasm of large intestine: Secondary | ICD-10-CM | POA: Insufficient documentation

## 2011-05-16 DIAGNOSIS — R51 Headache: Secondary | ICD-10-CM | POA: Insufficient documentation

## 2011-05-16 DIAGNOSIS — K219 Gastro-esophageal reflux disease without esophagitis: Secondary | ICD-10-CM | POA: Insufficient documentation

## 2011-05-16 DIAGNOSIS — Z79899 Other long term (current) drug therapy: Secondary | ICD-10-CM | POA: Insufficient documentation

## 2011-05-16 DIAGNOSIS — I10 Essential (primary) hypertension: Secondary | ICD-10-CM | POA: Insufficient documentation

## 2011-05-16 DIAGNOSIS — R110 Nausea: Secondary | ICD-10-CM | POA: Insufficient documentation

## 2011-05-16 HISTORY — DX: Malignant neoplasm of colon, unspecified: C18.9

## 2011-05-16 MED ORDER — DIPHENHYDRAMINE HCL 50 MG/ML IJ SOLN
50.0000 mg | Freq: Once | INTRAMUSCULAR | Status: AC
  Administered 2011-05-16: 50 mg via INTRAMUSCULAR
  Filled 2011-05-16: qty 1

## 2011-05-16 MED ORDER — METOCLOPRAMIDE HCL 5 MG/ML IJ SOLN
10.0000 mg | Freq: Once | INTRAMUSCULAR | Status: AC
  Administered 2011-05-16: 10 mg via INTRAMUSCULAR
  Filled 2011-05-16: qty 2

## 2011-05-16 MED ORDER — OXYCODONE-ACETAMINOPHEN 5-325 MG PO TABS: 1.0000 | ORAL_TABLET | Freq: Three times a day (TID) | ORAL | Status: AC | PRN

## 2011-05-16 MED ORDER — ONDANSETRON 8 MG PO TBDP
8.0000 mg | ORAL_TABLET | Freq: Once | ORAL | Status: AC
  Administered 2011-05-16: 8 mg via ORAL
  Filled 2011-05-16: qty 1

## 2011-05-16 MED ORDER — ONDANSETRON 8 MG PO TBDP: 8.0000 mg | ORAL_TABLET | Freq: Three times a day (TID) | ORAL | Status: AC | PRN

## 2011-05-16 MED ORDER — OXYCODONE-ACETAMINOPHEN 5-325 MG PO TABS
2.0000 | ORAL_TABLET | Freq: Once | ORAL | Status: AC
  Administered 2011-05-16: 2 via ORAL
  Filled 2011-05-16: qty 2

## 2011-05-16 NOTE — Discharge Instructions (Signed)

## 2011-05-16 NOTE — ED Notes (Signed)
HA, nausea x 5 days

## 2011-05-16 NOTE — ED Provider Notes (Signed)
This chart was scribed for Victoria Gaskins, MD by Wallis Mart. The patient was seen in room MH06/MH06 and the patient's care was started at 9:00 PM.   CSN: [id number]  Arrival date & time 05/16/11  1951   First MD Initiated Contact with Patient 05/16/11 2026      Chief Complaint  Patient presents with  . Headache     HPI Pt seen at 9:00 PM Victoria Sullivan is a 56 y.o. female who presents to the Emergency Department complaining of gradual onset, persistence of constant, gradually worsening, moderate to severe frontal headache onset 3 days ago upon waking up. Pain radiates to her face.  Pt rates pain as 10/10. Pt c/o associated nausea. Pt denies any vision or hearing changes, but states that she sees spots when the light changes. Denies fevers, weakness in arms and legs. Pt states this headache is similar to prior headaches which she has been having for years. Pt states she has been under a lot of stress for the past few months. There are no other associated symptoms and no other alleviating or aggravating factors. Past Medical History  Diagnosis Date  . Cancer   . Hypertension   . GERD (gastroesophageal reflux disease)   . Anemia   . GERD (gastroesophageal reflux disease)   . Tension headache   . Colon cancer     Past Surgical History  Procedure Date  . Cholecystectomy   . Abdominal hysterectomy   . Colon surgery     No family history on file.  History  Substance Use Topics  . Smoking status: Never Smoker   . Smokeless tobacco: Not on file  . Alcohol Use: No    OB History    Grav Para Term Preterm Abortions TAB SAB Ect Mult Living                  Review of Systems  Constitutional: Negative for fever.  HENT: Negative for rhinorrhea.   Eyes: Negative for pain.  Respiratory: Negative for cough and shortness of breath.   Cardiovascular: Negative for chest pain.  Gastrointestinal: Positive for nausea. Negative for vomiting, abdominal pain and diarrhea.    Genitourinary: Negative for dysuria.  Musculoskeletal: Negative for back pain.  Skin: Negative for rash.  Neurological: Positive for headaches. Negative for weakness.  All other systems reviewed and are negative.    Allergies  Nsaids  Home Medications   Current Outpatient Rx  Name Route Sig Dispense Refill  . ESOMEPRAZOLE MAGNESIUM 40 MG PO CPDR Oral Take 40 mg by mouth daily before breakfast.      . FERROUS SULFATE 325 (65 FE) MG PO TABS Oral Take 325 mg by mouth 3 (three) times daily.     . NEBIVOLOL HCL 20 MG PO TABS Oral Take 2 tablets by mouth daily.     Marland Kitchen OLMESARTAN-AMLODIPINE-HCTZ 40-10-25 MG PO TABS Oral Take 1 tablet by mouth daily.     Marland Kitchen BUTALBITAL-APAP-CAFFEINE 50-325-40 MG PO TABS Oral Take 2 tablets by mouth 2 (two) times daily as needed. For migraines      BP 155/82  Pulse 107  Temp(Src) 97.5 F (36.4 C) (Oral)  Resp 20  Ht 5\' 3"  (1.6 m)  Wt 215 lb (97.523 kg)  BMI 38.09 kg/m2  SpO2 100%  Physical Exam CONSTITUTIONAL: Well developed/well nourished HEAD AND FACE: Normocephalic/atraumatic EYES: EOMI/PERRL ENMT: Mucous membranes moist NECK: supple no meningeal signs, no bruits noted SPINE:entire spine nontender CV: S1/S2 noted, no murmurs/rubs/gallops noted LUNGS: Lungs  are clear to auscultation bilaterally, no apparent distress ABDOMEN: soft, nontender, no rebound or guarding GU:no cva tenderness NEURO: Awake/alert, facies symmetric, no arm or leg drift is noted Cranial nerves 3/4/5/6/09/12/08/11/12 tested and intact, no past pointing Gait normal EXTREMITIES: pulses normal, full ROM SKIN: warm, color normal PSYCH: no abnormalities of mood noted  ED Course  Procedures  DIAGNOSTIC STUDIES: Oxygen Saturation is 100% on room air, normal by my interpretation.    COORDINATION OF CARE:   Pt improved  reports she has been having these type of Headaches since her early 2s No focal neuro deficits Had negative CT head in 2010 Stable for d/c  The  patient appears reasonably screened and/or stabilized for discharge and I doubt any other medical condition or other [facility] requiring further screening, evaluation, or treatment in the ED at this time prior to discharge.     MDM  Nursing notes reviewed and considered in documentation Previous records reviewed and considered      I personally performed the services described in this documentation, which was scribed in my presence. The recorded information has been reviewed and considered.         Victoria Gaskins, MD 05/16/11 [phone]

## 2011-05-16 NOTE — ED Notes (Signed)
Pt sts head has been hurting since thurs,with no relief. Rate pain as 10/10

## 2011-11-28 ENCOUNTER — Emergency Department (HOSPITAL_BASED_OUTPATIENT_CLINIC_OR_DEPARTMENT_OTHER)
Admission: EM | Admit: 2011-11-28 | Discharge: 2011-11-29 | Disposition: A | Discharge status: Home / Self Care | Payer: Self-pay | Attending: Emergency Medicine | Admitting: Emergency Medicine

## 2011-11-28 ENCOUNTER — Encounter (HOSPITAL_BASED_OUTPATIENT_CLINIC_OR_DEPARTMENT_OTHER): Payer: Self-pay | Admitting: *Deleted

## 2011-11-28 DIAGNOSIS — Z85038 Personal history of other malignant neoplasm of large intestine: Secondary | ICD-10-CM | POA: Insufficient documentation

## 2011-11-28 DIAGNOSIS — R51 Headache: Secondary | ICD-10-CM | POA: Insufficient documentation

## 2011-11-28 DIAGNOSIS — K219 Gastro-esophageal reflux disease without esophagitis: Secondary | ICD-10-CM | POA: Insufficient documentation

## 2011-11-28 DIAGNOSIS — G43909 Migraine, unspecified, not intractable, without status migrainosus: Secondary | ICD-10-CM

## 2011-11-28 DIAGNOSIS — I10 Essential (primary) hypertension: Secondary | ICD-10-CM | POA: Insufficient documentation

## 2011-11-28 DIAGNOSIS — R110 Nausea: Secondary | ICD-10-CM | POA: Insufficient documentation

## 2011-11-28 MED ORDER — OXYCODONE-ACETAMINOPHEN 5-325 MG PO TABS
2.0000 | ORAL_TABLET | Freq: Once | ORAL | Status: AC
  Administered 2011-11-28: 2 via ORAL
  Filled 2011-11-28: qty 2

## 2011-11-28 MED ORDER — DIPHENHYDRAMINE HCL 50 MG/ML IJ SOLN
12.5000 mg | Freq: Once | INTRAMUSCULAR | Status: AC
  Administered 2011-11-28: 12.5 mg via INTRAMUSCULAR
  Filled 2011-11-28: qty 1

## 2011-11-28 MED ORDER — OXYCODONE-ACETAMINOPHEN 5-325 MG PO TABS
ORAL_TABLET | ORAL | Status: AC
  Administered 2011-11-28: 2 via ORAL
  Filled 2011-11-28: qty 2

## 2011-11-28 MED ORDER — METOCLOPRAMIDE HCL 5 MG/ML IJ SOLN
10.0000 mg | Freq: Once | INTRAMUSCULAR | Status: AC
  Administered 2011-11-28: 10 mg via INTRAMUSCULAR
  Filled 2011-11-28: qty 2

## 2011-11-28 MED ORDER — DEXAMETHASONE SODIUM PHOSPHATE 10 MG/ML IJ SOLN
10.0000 mg | Freq: Once | INTRAMUSCULAR | Status: AC
  Administered 2011-11-28: 10 mg via INTRAMUSCULAR

## 2011-11-28 MED ORDER — KETOROLAC TROMETHAMINE 60 MG/2ML IM SOLN
60.0000 mg | Freq: Once | INTRAMUSCULAR | Status: AC
  Administered 2011-11-28: 60 mg via INTRAMUSCULAR
  Filled 2011-11-28: qty 2

## 2011-11-28 MED ORDER — DEXAMETHASONE SODIUM PHOSPHATE 10 MG/ML IJ SOLN
INTRAMUSCULAR | Status: AC
  Filled 2011-11-28: qty 1

## 2011-11-28 MED ORDER — DEXAMETHASONE SODIUM PHOSPHATE 10 MG/ML IJ SOLN: 10.0000 mg | Freq: Once | INTRAMUSCULAR | Status: DC

## 2011-11-28 NOTE — ED Provider Notes (Signed)
History   This chart was scribed for [name] [name], MD by Sofie Rower. The patient was seen in room MH05/MH05 and the patient's care was started at 9:46PM.    CSN: [id number]  Arrival date & time 11/28/11  2057   First MD Initiated Contact with Patient 11/28/11 2146      Chief Complaint  Patient presents with  . Headache    (Consider location/radiation/quality/duration/timing/severity/associated sxs/prior treatment) Patient is a 56 y.o. female presenting with headaches. The history is provided by the patient. No language interpreter was used.  Headache  This is a new problem. The current episode started yesterday. The problem occurs constantly. The problem has been gradually worsening. The headache is associated with nothing. The pain is located in the frontal region. The quality of the pain is described as throbbing. The pain is moderate. The pain does not radiate. Associated symptoms include nausea. Pertinent negatives include no fever, no palpitations, no syncope, no shortness of breath and no vomiting. 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 The treatment provided no relief.    Victoria Sullivan is a 56 y.o. female , with a hx of tension headache (onset 5 months ago), who presents to the Emergency Department complaining of sudden, progressively worsening headache,  onset yesterday with associated symptoms of nausea. The pt reports the headache is a throbbing, banging sensation. Modifying factors include taking furoset which does not provide headache relief.  The pt denies vomiting, sinus congestion, and sinus pain.   The pt does not smoke or drink alcohol.   Past Medical History  Diagnosis Date  . Cancer   . Hypertension   . GERD (gastroesophageal reflux disease)   . Anemia   . GERD (gastroesophageal reflux disease)   . Tension headache   . Colon cancer     Past Surgical History  Procedure Date  . Cholecystectomy   . Abdominal hysterectomy   . Colon surgery     No family  history on file.  History  Substance Use Topics  . Smoking status: Never Smoker   . Smokeless tobacco: Not on file  . Alcohol Use: No    OB History    Grav Para Term Preterm Abortions TAB SAB Ect Mult Living                  Review of Systems  Constitutional: Negative for fever.  HENT: Negative for neck pain.   Respiratory: Negative for shortness of breath.   Cardiovascular: Negative for chest pain, palpitations and syncope.  Gastrointestinal: Positive for nausea. Negative for vomiting.  Neurological: Positive for headaches. Negative for dizziness, seizures, facial asymmetry, speech difficulty, weakness and numbness.  All other systems reviewed and are negative.    Allergies  Nsaids  Home Medications   Current Outpatient Rx  Name Route Sig Dispense Refill  . BUTALBITAL-APAP-CAFFEINE 50-325-40 MG PO TABS Oral Take 2 tablets by mouth 2 (two) times daily as needed. For migraines    . ESOMEPRAZOLE MAGNESIUM 40 MG PO CPDR Oral Take 40 mg by mouth daily before breakfast.      . FERROUS SULFATE 325 (65 FE) MG PO TABS Oral Take 325 mg by mouth 3 (three) times daily.     . NEBIVOLOL HCL 20 MG PO TABS Oral Take 2 tablets by mouth daily.     Marland Kitchen OLMESARTAN-AMLODIPINE-HCTZ 40-10-25 MG PO TABS Oral Take 1 tablet by mouth daily.       BP 181/93  Pulse 108  Temp 97.8 F (36.6 C) (Oral)  Resp 20  SpO2 98%  Physical Exam  Nursing note and vitals reviewed. Constitutional: She is oriented to person, place, and time. She appears well-developed and well-nourished.       Patient seated in room with lights on without difficulty  HENT:  Head: Normocephalic and atraumatic.  Right Ear: Tympanic membrane normal.  Left Ear: Tympanic membrane normal.  Nose: Nose normal.  Mouth/Throat: Oropharynx is clear and moist.  Eyes: Conjunctivae normal and EOM are normal. Pupils are equal, round, and reactive to light.  Neck: Normal range of motion. Neck supple.  Cardiovascular: Normal rate, regular  rhythm and normal heart sounds.   Pulmonary/Chest: Effort normal and breath sounds normal. She has no wheezes. She has no rales.  Abdominal: Soft. Bowel sounds are normal. There is no tenderness.  Musculoskeletal: Normal range of motion.  Neurological: She is alert and oriented to person, place, and time. No cranial nerve deficit.  Skin: Skin is warm and dry.  Psychiatric: She has a normal mood and affect. Her behavior is normal.    ED Course  Procedures (including critical care time)  DIAGNOSTIC STUDIES: Oxygen Saturation is 98% on room air, normal by my interpretation.    COORDINATION OF CARE:    10:06PM- Pain management and treatment plan discussed with patient. Pt agrees with treatment.   Labs Reviewed - No data to display No results found.   No diagnosis found.    MDM  No indication for imaging.  EDP does not treat migraines with narcotics.  Follow up with your doctor for ongoing care.  Return for worsening symptoms    I personally performed the services described in this documentation, which was scribed in my presence. The recorded information has been reviewed and considered.     Jasmine Awe, MD 11/29/11 [phone]

## 2011-11-28 NOTE — ED Notes (Signed)
Headache since yesterday. Nausea. 

## 2011-11-29 MED ORDER — TRAMADOL HCL 50 MG PO TABS: 50.0000 mg | ORAL_TABLET | Freq: Four times a day (QID) | ORAL | Status: DC | PRN

## 2012-01-21 ENCOUNTER — Encounter (HOSPITAL_BASED_OUTPATIENT_CLINIC_OR_DEPARTMENT_OTHER): Payer: Self-pay | Admitting: Emergency Medicine

## 2012-01-21 ENCOUNTER — Emergency Department (HOSPITAL_BASED_OUTPATIENT_CLINIC_OR_DEPARTMENT_OTHER)
Admission: EM | Admit: 2012-01-21 | Discharge: 2012-01-21 | Disposition: A | Discharge status: Home / Self Care | Payer: Self-pay | Attending: Emergency Medicine | Admitting: Emergency Medicine

## 2012-01-21 DIAGNOSIS — R51 Headache: Secondary | ICD-10-CM | POA: Insufficient documentation

## 2012-01-21 DIAGNOSIS — K219 Gastro-esophageal reflux disease without esophagitis: Secondary | ICD-10-CM | POA: Insufficient documentation

## 2012-01-21 DIAGNOSIS — I10 Essential (primary) hypertension: Secondary | ICD-10-CM | POA: Insufficient documentation

## 2012-01-21 DIAGNOSIS — G43909 Migraine, unspecified, not intractable, without status migrainosus: Secondary | ICD-10-CM | POA: Insufficient documentation

## 2012-01-21 DIAGNOSIS — Z79899 Other long term (current) drug therapy: Secondary | ICD-10-CM | POA: Insufficient documentation

## 2012-01-21 DIAGNOSIS — Z85038 Personal history of other malignant neoplasm of large intestine: Secondary | ICD-10-CM | POA: Insufficient documentation

## 2012-01-21 DIAGNOSIS — D649 Anemia, unspecified: Secondary | ICD-10-CM | POA: Insufficient documentation

## 2012-01-21 MED ORDER — HYDROMORPHONE HCL PF 2 MG/ML IJ SOLN
2.0000 mg | Freq: Once | INTRAMUSCULAR | Status: AC
  Administered 2012-01-21: 2 mg via INTRAMUSCULAR
  Filled 2012-01-21: qty 1

## 2012-01-21 MED ORDER — PROMETHAZINE HCL 25 MG PO TABS
25.0000 mg | ORAL_TABLET | Freq: Four times a day (QID) | ORAL | Status: DC | PRN
Start: 2012-01-21 — End: 2012-02-18

## 2012-01-21 MED ORDER — PROMETHAZINE HCL 25 MG/ML IJ SOLN
25.0000 mg | Freq: Once | INTRAMUSCULAR | Status: AC
  Administered 2012-01-21: 25 mg via INTRAMUSCULAR
  Filled 2012-01-21: qty 1

## 2012-01-21 MED ORDER — HYDROCODONE-ACETAMINOPHEN 5-325 MG PO TABS: 1.0000 | ORAL_TABLET | Freq: Four times a day (QID) | ORAL | Status: DC | PRN

## 2012-01-21 NOTE — ED Provider Notes (Signed)
History     CSN: [id number]  Arrival date & time 01/21/12  9629   First MD Initiated Contact with Patient 01/21/12 0636      Chief Complaint  Patient presents with  . Migraine    (Consider location/radiation/quality/duration/timing/severity/associated sxs/prior treatment) HPI This is a 56 year old female with a history of migraines. She has had a migraine since the evening before yesterday. It is located on the left side of her head and is consistent with previous migraines. The pain is moderate to severe. It is associated with nausea, vomiting and photophobia. She denies chest pain, shortness of breath, abdominal pain or focal neurologic deficit. She has had some visual disturbance. She is taking Fioricet without relief.  Past Medical History  Diagnosis Date  . Cancer   . Hypertension   . GERD (gastroesophageal reflux disease)   . Anemia   . GERD (gastroesophageal reflux disease)   . Tension headache   . Colon cancer     Past Surgical History  Procedure Date  . Cholecystectomy   . Abdominal hysterectomy   . Colon surgery     No family history on file.  History  Substance Use Topics  . Smoking status: Never Smoker   . Smokeless tobacco: Not on file  . Alcohol Use: No    OB History    Grav Para Term Preterm Abortions TAB SAB Ect Mult Living                  Review of Systems  All other systems reviewed and are negative.    Allergies  Nsaids  Home Medications   Current Outpatient Rx  Name  Route  Sig  Dispense  Refill  . BUTALBITAL-APAP-CAFFEINE 50-325-40 MG PO TABS   Oral   Take 2 tablets by mouth 2 (two) times daily as needed. For migraines         . ESOMEPRAZOLE MAGNESIUM 40 MG PO CPDR   Oral   Take 40 mg by mouth daily before breakfast.           . FERROUS SULFATE 325 (65 FE) MG PO TABS   Oral   Take 325 mg by mouth 3 (three) times daily.          . NEBIVOLOL HCL 20 MG PO TABS   Oral   Take 2 tablets by mouth daily.          Marland Kitchen  OLMESARTAN-AMLODIPINE-HCTZ 40-10-25 MG PO TABS   Oral   Take 1 tablet by mouth daily.          . TRAMADOL HCL 50 MG PO TABS   Oral   Take 1 tablet (50 mg total) by mouth every 6 (six) hours as needed for pain.   11 tablet   0     BP 157/89  Pulse 100  Temp 97.8 F (36.6 C) (Oral)  Resp 18  SpO2 100%  Physical Exam General: Well-developed, well-nourished female in no acute distress; appearance consistent with age of record HENT: normocephalic, atraumatic Eyes: pupils equal round and reactive to light; extraocular muscles intact Neck: supple Heart: regular rate and rhythm Lungs: clear to auscultation bilaterally Abdomen: soft; nondistended; nontender; bowel sounds present Extremities: No deformity; full range of motion Neurologic: Awake, alert and oriented; motor function intact in all extremities and symmetric; no facial droop; normal gait Skin: Warm and dry Psychiatric: Normal mood and affect    ED Course  Procedures (including critical care time)     MDM  Hanley Seamen, MD 01/21/12 [phone]

## 2012-01-21 NOTE — ED Notes (Signed)
Pt c/o migraine headache since thurs with nausea. Pt states headache is typical for her migraines.

## 2012-02-18 ENCOUNTER — Emergency Department (HOSPITAL_BASED_OUTPATIENT_CLINIC_OR_DEPARTMENT_OTHER)
Admission: EM | Admit: 2012-02-18 | Discharge: 2012-02-18 | Disposition: A | Discharge status: Home / Self Care | Payer: Self-pay | Attending: Emergency Medicine | Admitting: Emergency Medicine

## 2012-02-18 ENCOUNTER — Encounter (HOSPITAL_BASED_OUTPATIENT_CLINIC_OR_DEPARTMENT_OTHER): Payer: Self-pay | Admitting: *Deleted

## 2012-02-18 DIAGNOSIS — D649 Anemia, unspecified: Secondary | ICD-10-CM | POA: Insufficient documentation

## 2012-02-18 DIAGNOSIS — I10 Essential (primary) hypertension: Secondary | ICD-10-CM | POA: Insufficient documentation

## 2012-02-18 DIAGNOSIS — Z85038 Personal history of other malignant neoplasm of large intestine: Secondary | ICD-10-CM | POA: Insufficient documentation

## 2012-02-18 DIAGNOSIS — K219 Gastro-esophageal reflux disease without esophagitis: Secondary | ICD-10-CM | POA: Insufficient documentation

## 2012-02-18 DIAGNOSIS — G43909 Migraine, unspecified, not intractable, without status migrainosus: Secondary | ICD-10-CM | POA: Insufficient documentation

## 2012-02-18 DIAGNOSIS — Z79899 Other long term (current) drug therapy: Secondary | ICD-10-CM | POA: Insufficient documentation

## 2012-02-18 HISTORY — DX: Migraine, unspecified, not intractable, without status migrainosus: G43.909

## 2012-02-18 MED ORDER — PROMETHAZINE HCL 25 MG/ML IJ SOLN
25.0000 mg | Freq: Once | INTRAMUSCULAR | Status: AC
  Administered 2012-02-18: 25 mg via INTRAMUSCULAR
  Filled 2012-02-18: qty 1

## 2012-02-18 MED ORDER — PROMETHAZINE HCL 25 MG PO TABS: 25.0000 mg | ORAL_TABLET | Freq: Four times a day (QID) | ORAL | Status: DC | PRN

## 2012-02-18 MED ORDER — HYDROMORPHONE HCL PF 2 MG/ML IJ SOLN
2.0000 mg | Freq: Once | INTRAMUSCULAR | Status: AC
  Administered 2012-02-18: 2 mg via INTRAMUSCULAR
  Filled 2012-02-18: qty 1

## 2012-02-18 MED ORDER — HYDROCODONE-ACETAMINOPHEN 5-325 MG PO TABS: 1.0000 | ORAL_TABLET | Freq: Four times a day (QID) | ORAL | Status: DC | PRN

## 2012-02-18 NOTE — ED Provider Notes (Signed)
History     CSN: [id number]  Arrival date & time 02/18/12  0010   First MD Initiated Contact with Patient 02/18/12 0158      Chief Complaint  Patient presents with  . Migraine    (Consider location/radiation/quality/duration/timing/severity/associated sxs/prior treatment) HPI This is a 56 year old female with a history of migraines. She has had a headache for the past 3 days. It started gradually but is become severe. It is located on the front of the head on the left side. It is throbbing. It is like prior migraines. It is been associated with nausea and vomiting. She's had photophobia and some occasional scotomata. There's been no focal neurologic deficit. She's been taking Fioricet without relief.  Past Medical History  Diagnosis Date  . Cancer   . Hypertension   . GERD (gastroesophageal reflux disease)   . Anemia   . GERD (gastroesophageal reflux disease)   . Tension headache   . Colon cancer     Past Surgical History  Procedure Date  . Cholecystectomy   . Abdominal hysterectomy   . Colon surgery     No family history on file.  History  Substance Use Topics  . Smoking status: Never Smoker   . Smokeless tobacco: Not on file  . Alcohol Use: No    OB History    Grav Para Term Preterm Abortions TAB SAB Ect Mult Living                  Review of Systems  All other systems reviewed and are negative.    Allergies  Nsaids  Home Medications   Current Outpatient Rx  Name  Route  Sig  Dispense  Refill  . BUTALBITAL-APAP-CAFFEINE 50-325-40 MG PO TABS   Oral   Take 2 tablets by mouth 2 (two) times daily as needed. For migraines         . ESOMEPRAZOLE MAGNESIUM 40 MG PO CPDR   Oral   Take 40 mg by mouth daily before breakfast.           . FERROUS SULFATE 325 (65 FE) MG PO TABS   Oral   Take 325 mg by mouth 3 (three) times daily.          Marland Kitchen HYDROCODONE-ACETAMINOPHEN 5-325 MG PO TABS   Oral   Take 1-2 tablets by mouth every 6 (six) hours as  needed for pain.   10 tablet   0   . NEBIVOLOL HCL 20 MG PO TABS   Oral   Take 2 tablets by mouth daily.          Marland Kitchen OLMESARTAN-AMLODIPINE-HCTZ 40-10-25 MG PO TABS   Oral   Take 1 tablet by mouth daily.          Marland Kitchen PROMETHAZINE HCL 25 MG PO TABS   Oral   Take 1 tablet (25 mg total) by mouth every 6 (six) hours as needed for nausea.   10 tablet   0   . PROMETHAZINE HCL 25 MG PO TABS   Oral   Take 1 tablet (25 mg total) by mouth every 6 (six) hours as needed for nausea.   10 tablet   0   . TRAMADOL HCL 50 MG PO TABS   Oral   Take 1 tablet (50 mg total) by mouth every 6 (six) hours as needed for pain.   11 tablet   0     BP 151/114  Pulse 73  Temp 98 F (36.7 C) (Oral)  Resp  18  SpO2 100%  Physical Exam General: Well-developed, well-nourished female in no acute distress; appearance consistent with age of record HENT: normocephalic, atraumatic Eyes: pupils equal round and reactive to light; extraocular muscles intact Neck: supple Heart: regular rate and rhythm Lungs: clear to auscultation bilaterally Abdomen: soft; nondistended; nontender; bowel sounds present Extremities: No deformity; full range of motion Neurologic: Awake, alert and oriented; motor function intact in all extremities and symmetric; no facial droop Skin: Warm and dry Psychiatric: Normal mood and affect    ED Course  Procedures (including critical care time)     MDM          Hanley Seamen, MD 02/18/12 0205

## 2012-02-18 NOTE — ED Notes (Signed)
MD at bedside. 

## 2012-02-18 NOTE — ED Notes (Signed)
Pt c/o migraine type HA since Wednesday. Pt reports no relief with fiorocet.

## 2012-05-27 ENCOUNTER — Encounter (HOSPITAL_BASED_OUTPATIENT_CLINIC_OR_DEPARTMENT_OTHER): Payer: Self-pay

## 2012-05-27 ENCOUNTER — Emergency Department (HOSPITAL_BASED_OUTPATIENT_CLINIC_OR_DEPARTMENT_OTHER)
Admission: EM | Admit: 2012-05-27 | Discharge: 2012-05-27 | Disposition: A | Discharge status: Home / Self Care | Payer: Self-pay | Attending: Emergency Medicine | Admitting: Emergency Medicine

## 2012-05-27 ENCOUNTER — Emergency Department (HOSPITAL_BASED_OUTPATIENT_CLINIC_OR_DEPARTMENT_OTHER): Payer: Self-pay

## 2012-05-27 DIAGNOSIS — D649 Anemia, unspecified: Secondary | ICD-10-CM | POA: Insufficient documentation

## 2012-05-27 DIAGNOSIS — Z85038 Personal history of other malignant neoplasm of large intestine: Secondary | ICD-10-CM | POA: Insufficient documentation

## 2012-05-27 DIAGNOSIS — R110 Nausea: Secondary | ICD-10-CM | POA: Insufficient documentation

## 2012-05-27 DIAGNOSIS — K219 Gastro-esophageal reflux disease without esophagitis: Secondary | ICD-10-CM | POA: Insufficient documentation

## 2012-05-27 DIAGNOSIS — I10 Essential (primary) hypertension: Secondary | ICD-10-CM | POA: Insufficient documentation

## 2012-05-27 DIAGNOSIS — Z8659 Personal history of other mental and behavioral disorders: Secondary | ICD-10-CM | POA: Insufficient documentation

## 2012-05-27 DIAGNOSIS — G43909 Migraine, unspecified, not intractable, without status migrainosus: Secondary | ICD-10-CM | POA: Insufficient documentation

## 2012-05-27 DIAGNOSIS — Z79899 Other long term (current) drug therapy: Secondary | ICD-10-CM | POA: Insufficient documentation

## 2012-05-27 MED ORDER — KETOROLAC TROMETHAMINE 60 MG/2ML IM SOLN: 60.0000 mg | Freq: Once | INTRAMUSCULAR | Status: DC

## 2012-05-27 MED ORDER — HYDROMORPHONE HCL PF 1 MG/ML IJ SOLN
0.5000 mg | Freq: Once | INTRAMUSCULAR | Status: AC
  Administered 2012-05-27: 0.5 mg via INTRAMUSCULAR
  Filled 2012-05-27: qty 1

## 2012-05-27 MED ORDER — METOCLOPRAMIDE HCL 5 MG/ML IJ SOLN
10.0000 mg | Freq: Once | INTRAMUSCULAR | Status: AC
  Administered 2012-05-27: 10 mg via INTRAMUSCULAR
  Filled 2012-05-27: qty 2

## 2012-05-27 MED ORDER — PROMETHAZINE HCL 25 MG RE SUPP: 25.0000 mg | Freq: Four times a day (QID) | RECTAL | Status: DC | PRN

## 2012-05-27 MED ORDER — DEXAMETHASONE SODIUM PHOSPHATE 10 MG/ML IJ SOLN
10.0000 mg | Freq: Once | INTRAMUSCULAR | Status: AC
  Administered 2012-05-27: 10 mg via INTRAMUSCULAR
  Filled 2012-05-27: qty 1

## 2012-05-27 MED ORDER — DIPHENHYDRAMINE HCL 50 MG/ML IJ SOLN
12.5000 mg | Freq: Once | INTRAMUSCULAR | Status: AC
  Administered 2012-05-27: 12.5 mg via INTRAMUSCULAR
  Filled 2012-05-27: qty 1

## 2012-05-27 MED ORDER — PROMETHAZINE HCL 25 MG/ML IJ SOLN
25.0000 mg | Freq: Once | INTRAMUSCULAR | Status: AC
  Administered 2012-05-27: 25 mg via INTRAMUSCULAR
  Filled 2012-05-27: qty 1

## 2012-05-27 NOTE — ED Notes (Signed)
Pt told she cannot drive after receiving narcotic pain medication. Pt wheeled to waiting room. States husband will be driving her home.

## 2012-05-27 NOTE — ED Notes (Signed)
Patient reports that she developed frontal headache with radiation around left temporal area, reports vomiting with same. Unable to sleep tonight or rest. Reports headaches similar to this in past. Alert and oriented. Took Fioricet last night 9pm w/o relief

## 2012-05-27 NOTE — ED Notes (Signed)
Transported to CT 

## 2012-05-27 NOTE — ED Provider Notes (Signed)
History     CSN: [id number]  Arrival date & time 05/27/12  0532   First MD Initiated Contact with Patient 05/27/12 [phone]      Chief Complaint  Patient presents with  . Migraine    (Consider location/radiation/quality/duration/timing/severity/associated sxs/prior treatment) Patient is a 57 y.o. female presenting with migraines. The history is provided by the patient. No language interpreter was used.  Migraine This is a recurrent problem. The current episode started yesterday. The problem occurs constantly. The problem has not changed since onset.Pertinent negatives include no chest pain, no abdominal pain and no shortness of breath. Nothing aggravates the symptoms. Nothing relieves the symptoms. Treatments tried: fiorcet. The treatment provided mild relief.  No f/c/r.  No rash.  No weakness no numbness no changes in vision nor speech.  No sudden onset not the worst headache of her life she had one this severe last month.    Past Medical History  Diagnosis Date  . Cancer   . Hypertension   . Anemia   . GERD (gastroesophageal reflux disease)   . Tension headache   . Colon cancer   . Migraine     Past Surgical History  Procedure Laterality Date  . Cholecystectomy    . Abdominal hysterectomy    . Colon surgery      No family history on file.  History  Substance Use Topics  . Smoking status: Never Smoker   . Smokeless tobacco: Not on file  . Alcohol Use: No    OB History   Grav Para Term Preterm Abortions TAB SAB Ect Mult Living                  Review of Systems  Constitutional: Negative for fever.  HENT: Negative for neck pain and neck stiffness.   Eyes: Negative for photophobia and visual disturbance.  Respiratory: Negative for shortness of breath.   Cardiovascular: Negative for chest pain.  Gastrointestinal: Positive for nausea. Negative for abdominal pain.  Neurological: Negative for speech difficulty, weakness and numbness.  All other systems reviewed and  are negative.    Allergies  Nsaids  Home Medications   Current Outpatient Rx  Name  Route  Sig  Dispense  Refill  . butalbital-acetaminophen-caffeine (FIORICET, ESGIC) 50-325-40 MG per tablet   Oral   Take 2 tablets by mouth 2 (two) times daily as needed. For migraines         . esomeprazole (NEXIUM) 40 MG capsule   Oral   Take 40 mg by mouth daily before breakfast.           . ferrous sulfate 325 (65 FE) MG tablet   Oral   Take 325 mg by mouth 3 (three) times daily.          . Nebivolol HCl (BYSTOLIC) 20 MG TABS   Oral   Take 2 tablets by mouth daily.          . Olmesartan-Amlodipine-HCTZ (TRIBENZOR) 40-10-25 MG TABS   Oral   Take 1 tablet by mouth daily.          Marland Kitchen EXPIRED: promethazine (PHENERGAN) 25 MG tablet   Oral   Take 1 tablet (25 mg total) by mouth every 6 (six) hours as needed for nausea.   10 tablet   0   . promethazine (PHENERGAN) 25 MG tablet   Oral   Take 1 tablet (25 mg total) by mouth every 6 (six) hours as needed for nausea.   10 tablet   0  BP 180/95  Pulse 84  Temp(Src) 97.6 F (36.4 C) (Oral)  Resp 22  SpO2 100%  Physical Exam  Constitutional: She is oriented to person, place, and time. She appears well-developed and well-nourished. No distress.  HENT:  Head: Normocephalic and atraumatic.  Mouth/Throat: Oropharynx is clear and moist.  No temporal bulge no tenderness  Eyes: Conjunctivae and EOM are normal. Pupils are equal, round, and reactive to light.  Neck: Normal range of motion. Neck supple.  No meningeal signs  Cardiovascular: Normal rate, regular rhythm and intact distal pulses.   Pulmonary/Chest: Effort normal and breath sounds normal. No respiratory distress. She has no wheezes. She has no rales.  Abdominal: Soft. Bowel sounds are normal. There is no tenderness. There is no rebound and no guarding.  Musculoskeletal: Normal range of motion.  Neurological: She is alert and oriented to person, place, and time. She  has normal reflexes. No cranial nerve deficit. GCS eye subscore is 4. GCS verbal subscore is 5. GCS motor subscore is 6.  Skin: Skin is warm and dry.  Psychiatric: She has a normal mood and affect.    ED Course  Procedures (including critical care time)  Labs Reviewed - No data to display No results found.   No diagnosis found.    MDM  Symptoms consistent with patient's previous migraines.  No indication for LP.          Jasmine Awe, MD 05/27/12 [phone]

## 2012-08-19 ENCOUNTER — Encounter (HOSPITAL_BASED_OUTPATIENT_CLINIC_OR_DEPARTMENT_OTHER): Payer: Self-pay | Admitting: *Deleted

## 2012-08-19 ENCOUNTER — Emergency Department (HOSPITAL_BASED_OUTPATIENT_CLINIC_OR_DEPARTMENT_OTHER)
Admission: EM | Admit: 2012-08-19 | Discharge: 2012-08-19 | Disposition: A | Discharge status: Home / Self Care | Payer: Self-pay | Attending: Emergency Medicine | Admitting: Emergency Medicine

## 2012-08-19 DIAGNOSIS — R0602 Shortness of breath: Secondary | ICD-10-CM | POA: Insufficient documentation

## 2012-08-19 DIAGNOSIS — D649 Anemia, unspecified: Secondary | ICD-10-CM | POA: Insufficient documentation

## 2012-08-19 DIAGNOSIS — Z85038 Personal history of other malignant neoplasm of large intestine: Secondary | ICD-10-CM | POA: Insufficient documentation

## 2012-08-19 DIAGNOSIS — R5381 Other malaise: Secondary | ICD-10-CM | POA: Insufficient documentation

## 2012-08-19 DIAGNOSIS — R110 Nausea: Secondary | ICD-10-CM | POA: Insufficient documentation

## 2012-08-19 DIAGNOSIS — K219 Gastro-esophageal reflux disease without esophagitis: Secondary | ICD-10-CM | POA: Insufficient documentation

## 2012-08-19 DIAGNOSIS — I10 Essential (primary) hypertension: Secondary | ICD-10-CM | POA: Insufficient documentation

## 2012-08-19 DIAGNOSIS — Z79899 Other long term (current) drug therapy: Secondary | ICD-10-CM | POA: Insufficient documentation

## 2012-08-19 DIAGNOSIS — H53149 Visual discomfort, unspecified: Secondary | ICD-10-CM | POA: Insufficient documentation

## 2012-08-19 DIAGNOSIS — R51 Headache: Secondary | ICD-10-CM | POA: Insufficient documentation

## 2012-08-19 LAB — BASIC METABOLIC PANEL
BUN: 15 mg/dL (ref 6–23)
CO2: 26 mEq/L (ref 19–32)
Chloride: 106 mEq/L (ref 96–112)
Creatinine, Ser: 0.7 mg/dL (ref 0.50–1.10)

## 2012-08-19 LAB — CBC WITH DIFFERENTIAL/PLATELET
HCT: 38.8 % (ref 36.0–46.0)
Hemoglobin: 12.6 g/dL (ref 12.0–15.0)
Lymphocytes Relative: 11 % — ABNORMAL LOW (ref 12–46)
Monocytes Absolute: 0.3 10*3/uL (ref 0.1–1.0)
Monocytes Relative: 4 % (ref 3–12)
Neutro Abs: 5.3 10*3/uL (ref 1.7–7.7)
WBC: 6.3 10*3/uL (ref 4.0–10.5)

## 2012-08-19 MED ORDER — METOCLOPRAMIDE HCL 5 MG/ML IJ SOLN: 10.0000 mg | Freq: Once | INTRAMUSCULAR | Status: DC

## 2012-08-19 MED ORDER — PROMETHAZINE HCL 25 MG/ML IJ SOLN
25.0000 mg | Freq: Once | INTRAMUSCULAR | Status: AC
  Administered 2012-08-19: 25 mg via INTRAMUSCULAR

## 2012-08-19 MED ORDER — SODIUM CHLORIDE 0.9 % IV BOLUS (SEPSIS)
1000.0000 mL | Freq: Once | INTRAVENOUS | Status: AC
  Administered 2012-08-19: 1000 mL via INTRAVENOUS

## 2012-08-19 MED ORDER — DEXAMETHASONE SODIUM PHOSPHATE 10 MG/ML IJ SOLN
10.0000 mg | Freq: Once | INTRAMUSCULAR | Status: AC
  Administered 2012-08-19: 10 mg via INTRAMUSCULAR

## 2012-08-19 MED ORDER — HYDROMORPHONE HCL PF 1 MG/ML IJ SOLN
INTRAMUSCULAR | Status: AC
  Administered 2012-08-19: 1 mg
  Filled 2012-08-19: qty 1

## 2012-08-19 MED ORDER — HYDROMORPHONE HCL PF 1 MG/ML IJ SOLN
1.0000 mg | Freq: Once | INTRAMUSCULAR | Status: DC
  Filled 2012-08-19: qty 1

## 2012-08-19 MED ORDER — PROMETHAZINE HCL 25 MG PO TABS: 25.0000 mg | ORAL_TABLET | Freq: Four times a day (QID) | ORAL | Status: DC | PRN

## 2012-08-19 MED ORDER — LABETALOL HCL 5 MG/ML IV SOLN
INTRAVENOUS | Status: AC
  Filled 2012-08-19: qty 4

## 2012-08-19 MED ORDER — SODIUM CHLORIDE 0.9 % IV BOLUS (SEPSIS): 1000.0000 mL | Freq: Once | INTRAVENOUS | Status: DC

## 2012-08-19 MED ORDER — DIPHENHYDRAMINE HCL 50 MG/ML IJ SOLN
25.0000 mg | Freq: Once | INTRAMUSCULAR | Status: AC
  Administered 2012-08-19: 25 mg via INTRAMUSCULAR

## 2012-08-19 MED ORDER — PROMETHAZINE HCL 25 MG/ML IJ SOLN
25.0000 mg | Freq: Once | INTRAMUSCULAR | Status: DC
  Filled 2012-08-19: qty 1

## 2012-08-19 MED ORDER — DEXAMETHASONE SODIUM PHOSPHATE 10 MG/ML IJ SOLN
10.0000 mg | Freq: Once | INTRAMUSCULAR | Status: DC
  Filled 2012-08-19: qty 1

## 2012-08-19 MED ORDER — HYDROMORPHONE HCL PF 1 MG/ML IJ SOLN
1.0000 mg | Freq: Once | INTRAMUSCULAR | Status: AC
  Administered 2012-08-19: 1 mg via INTRAMUSCULAR

## 2012-08-19 MED ORDER — TRAMADOL HCL 50 MG PO TABS: 50.0000 mg | ORAL_TABLET | Freq: Four times a day (QID) | ORAL | Status: DC | PRN

## 2012-08-19 MED ORDER — DIPHENHYDRAMINE HCL 50 MG/ML IJ SOLN
25.0000 mg | Freq: Once | INTRAMUSCULAR | Status: DC
  Filled 2012-08-19: qty 1

## 2012-08-19 NOTE — ED Notes (Addendum)
Pt states she has a hx of migraines and this one started on Friday. +nausea PERRL

## 2012-08-19 NOTE — ED Provider Notes (Addendum)
Angiocath insertion Performed by: Pollyann Savoy.  Consent: Verbal consent obtained. Risks and benefits: risks, benefits and alternatives were discussed Time out: Immediately prior to procedure a "time out" was called to verify the correct patient, procedure, equipment, support staff and site/side marked as required.  Preparation: Patient was prepped and draped in the usual sterile fashion.  Vein Location: R antecubital   Ultrasound Guided  Gauge: 20  Normal blood return and flush without difficulty Patient tolerance: Patient tolerated the procedure well with no immediate complications.  Medical screening examination/treatment/procedure(s) were conducted as a shared visit with non-physician practitioner(s) and myself.  I personally evaluated the patient during the encounter   Charles B. Bernette Mayers, MD 08/19/12 1755  Bonnita Levan. Bernette Mayers, MD 08/19/12 2047

## 2012-08-19 NOTE — ED Provider Notes (Signed)
History     CSN: [id number]  Arrival date & time 08/19/12  1409   First MD Initiated Contact with Patient 08/19/12 1416      Chief Complaint  Patient presents with  . Migraine    (Consider location/radiation/quality/duration/timing/severity/associated sxs/prior treatment) HPI Comments: Patient is a 57 year old female with a past medical history of chronic migraines who presents with a headache for 3 days. Patient reports a gradual onset and progressive worsening of the headache. The pain is sharp, constant and is located in generalized head without radiation. Patient has tried Fioricet for symptoms without relief. No alleviating/aggravating factors. Patient reports associated nausea, photophobia, SOB and weakness. She also reports "funny smelling stools" and has a history of colon cancer. Patient denies fever, vomiting, diarrhea, numbness/tingling, weakness, visual changes, congestion, chest pain, SOB, abdominal pain.     Patient is a 57 y.o. female presenting with migraines.  Migraine Associated symptoms include fatigue, headaches and nausea.    Past Medical History  Diagnosis Date  . Cancer   . Hypertension   . Anemia   . GERD (gastroesophageal reflux disease)   . Tension headache   . Colon cancer   . Migraine     Past Surgical History  Procedure Laterality Date  . Cholecystectomy    . Abdominal hysterectomy    . Colon surgery      History reviewed. No pertinent family history.  History  Substance Use Topics  . Smoking status: Never Smoker   . Smokeless tobacco: Not on file  . Alcohol Use: No    OB History   Grav Para Term Preterm Abortions TAB SAB Ect Mult Living                  Review of Systems  Constitutional: Positive for fatigue.  Respiratory: Positive for shortness of breath.   Gastrointestinal: Positive for nausea.  Neurological: Positive for headaches.  All other systems reviewed and are negative.    Allergies  Nsaids  Home Medications    Current Outpatient Rx  Name  Route  Sig  Dispense  Refill  . butalbital-acetaminophen-caffeine (FIORICET, ESGIC) 50-325-40 MG per tablet   Oral   Take 2 tablets by mouth 2 (two) times daily as needed. For migraines         . esomeprazole (NEXIUM) 40 MG capsule   Oral   Take 40 mg by mouth daily before breakfast.           . ferrous sulfate 325 (65 FE) MG tablet   Oral   Take 325 mg by mouth 3 (three) times daily.          . Nebivolol HCl (BYSTOLIC) 20 MG TABS   Oral   Take 2 tablets by mouth daily.          . Olmesartan-Amlodipine-HCTZ (TRIBENZOR) 40-10-25 MG TABS   Oral   Take 1 tablet by mouth daily.          . promethazine (PHENERGAN) 25 MG suppository   Rectal   Place 1 suppository (25 mg total) rectally every 6 (six) hours as needed for nausea.   4 each   0   . EXPIRED: promethazine (PHENERGAN) 25 MG tablet   Oral   Take 1 tablet (25 mg total) by mouth every 6 (six) hours as needed for nausea.   10 tablet   0   . promethazine (PHENERGAN) 25 MG tablet   Oral   Take 1 tablet (25 mg total) by mouth every  6 (six) hours as needed for nausea.   10 tablet   0     BP 197/96  Pulse 124  Temp(Src) 98.3 F (36.8 C) (Oral)  Resp 20  Ht 5\' 3"  (1.6 m)  Wt 210 lb (95.255 kg)  BMI 37.21 kg/m2  SpO2 100%  Physical Exam  Nursing note and vitals reviewed. Constitutional: She is oriented to person, place, and time. She appears well-developed and well-nourished. No distress.  HENT:  Head: Normocephalic and atraumatic.  Eyes: Conjunctivae and EOM are normal.  Neck: Normal range of motion.  Cardiovascular: Normal rate and regular rhythm.  Exam reveals no gallop and no friction rub.   No murmur heard. Pulmonary/Chest: Effort normal and breath sounds normal. She has no wheezes. She has no rales. She exhibits no tenderness.  Abdominal: Soft. She exhibits no distension. There is no tenderness. There is no rebound and no guarding.  Musculoskeletal: Normal range  of motion.  Neurological: She is alert and oriented to person, place, and time. Coordination normal.  Speech is goal-oriented. Moves limbs without ataxia.   Skin: Skin is warm and dry.  Psychiatric: She has a normal mood and affect. Her behavior is normal.    ED Course  Procedures (including critical care time)  Labs Reviewed  CBC WITH DIFFERENTIAL - Abnormal; Notable for the following:    RDW 15.9 (*)    Neutrophils Relative % 84 (*)    Lymphocytes Relative 11 (*)    All other components within normal limits  BASIC METABOLIC PANEL - Abnormal; Notable for the following:    Glucose, Bld 106 (*)    All other components within normal limits   No results found.   1. Headache       MDM  4:05 PM Patient will have phenergan, dilaudid, and decadron. Patient is tachycardic.   8:22 PM Labs unremarkable. Patient feeling better. Patient instructed to follow up with her PCP. Vitals stable and patient afebrile. Patient instructed to return to the ED with worsening or concerning symptoms.      Emilia Beck, PA-C 08/19/12 2023

## 2012-08-19 NOTE — ED Notes (Signed)
I was notified by Brett Canales, RN, that the patient needed a PIV for med admin, hydration, and blood draws, Brett Canales had previous attempted x2 sticks both were unsuccessful.  This RN attempted to stick the patient x2 and was unsuccessful.  Brett Canales RN was notified of this, as was Florentina Addison, [place].  Orders were changed on medication to IM injections per Florentina Addison, PA-C.  Brett Canales, RN stated that he would address the med admin of these injections.

## 2012-08-25 ENCOUNTER — Encounter (HOSPITAL_COMMUNITY): Payer: Self-pay | Admitting: Emergency Medicine

## 2012-08-25 ENCOUNTER — Emergency Department (HOSPITAL_COMMUNITY)
Admission: EM | Admit: 2012-08-25 | Discharge: 2012-08-25 | Disposition: A | Discharge status: Home / Self Care | Payer: Self-pay | Attending: Emergency Medicine | Admitting: Emergency Medicine

## 2012-08-25 DIAGNOSIS — Z8679 Personal history of other diseases of the circulatory system: Secondary | ICD-10-CM | POA: Insufficient documentation

## 2012-08-25 DIAGNOSIS — R55 Syncope and collapse: Secondary | ICD-10-CM | POA: Insufficient documentation

## 2012-08-25 DIAGNOSIS — Z79899 Other long term (current) drug therapy: Secondary | ICD-10-CM | POA: Insufficient documentation

## 2012-08-25 DIAGNOSIS — I10 Essential (primary) hypertension: Secondary | ICD-10-CM | POA: Insufficient documentation

## 2012-08-25 DIAGNOSIS — E669 Obesity, unspecified: Secondary | ICD-10-CM | POA: Insufficient documentation

## 2012-08-25 DIAGNOSIS — Z859 Personal history of malignant neoplasm, unspecified: Secondary | ICD-10-CM | POA: Insufficient documentation

## 2012-08-25 DIAGNOSIS — K219 Gastro-esophageal reflux disease without esophagitis: Secondary | ICD-10-CM | POA: Insufficient documentation

## 2012-08-25 DIAGNOSIS — G43909 Migraine, unspecified, not intractable, without status migrainosus: Secondary | ICD-10-CM

## 2012-08-25 DIAGNOSIS — R51 Headache: Secondary | ICD-10-CM | POA: Insufficient documentation

## 2012-08-25 DIAGNOSIS — Z862 Personal history of diseases of the blood and blood-forming organs and certain disorders involving the immune mechanism: Secondary | ICD-10-CM | POA: Insufficient documentation

## 2012-08-25 DIAGNOSIS — H53149 Visual discomfort, unspecified: Secondary | ICD-10-CM | POA: Insufficient documentation

## 2012-08-25 DIAGNOSIS — Z85038 Personal history of other malignant neoplasm of large intestine: Secondary | ICD-10-CM | POA: Insufficient documentation

## 2012-08-25 MED ORDER — ONDANSETRON HCL 4 MG/2ML IJ SOLN
4.0000 mg | Freq: Once | INTRAMUSCULAR | Status: AC
  Administered 2012-08-25: 4 mg via INTRAVENOUS
  Filled 2012-08-25: qty 2

## 2012-08-25 MED ORDER — METOCLOPRAMIDE HCL 5 MG/ML IJ SOLN
10.0000 mg | Freq: Once | INTRAMUSCULAR | Status: AC
  Administered 2012-08-25: 10 mg via INTRAVENOUS
  Filled 2012-08-25: qty 2

## 2012-08-25 MED ORDER — SODIUM CHLORIDE 0.9 % IV BOLUS (SEPSIS)
1000.0000 mL | Freq: Once | INTRAVENOUS | Status: AC
  Administered 2012-08-25: 1000 mL via INTRAVENOUS

## 2012-08-25 MED ORDER — LABETALOL HCL 5 MG/ML IV SOLN
20.0000 mg | Freq: Once | INTRAVENOUS | Status: AC
  Administered 2012-08-25: 20 mg via INTRAVENOUS
  Filled 2012-08-25: qty 4

## 2012-08-25 MED ORDER — HYDROMORPHONE HCL PF 1 MG/ML IJ SOLN
1.0000 mg | Freq: Once | INTRAMUSCULAR | Status: AC
  Administered 2012-08-25: 1 mg via INTRAVENOUS
  Filled 2012-08-25: qty 1

## 2012-08-25 NOTE — ED Notes (Signed)
Attempted iv x 1 in right ac and line blew, iv team paged and at bedside now.

## 2012-08-25 NOTE — ED Notes (Addendum)
PT. REPORTS PERSISTENT HEADACHE FOR SEVERAL DAYS WITH HYPERTENSION  , SEEN AT [facility] LAST 08/19/2012 FOR THE SAME  COMPLAINT WITH NO IMPROVEMENT.  HYPERTENSIVE AT TRIAGE .

## 2012-08-25 NOTE — ED Provider Notes (Signed)
History     CSN: [id number]  Arrival date & time 08/25/12  0039   First MD Initiated Contact with Patient 08/25/12 0149      Chief Complaint  Patient presents with  . Headache  . Hypertension    (Consider location/radiation/quality/duration/timing/severity/associated sxs/prior treatment) HPI Victoria Sullivan is a very pleasant 57 year old woman who has had chronic migraine headaches and she underwent TAH/BSO at the age of 51 for uterine fibroids. She also has a history of hypertension and is obese. She has a remote history of colon cancer.  She presents today with complaints of bilateral frontal headache pain. Denies any recent trauma. Headache began 7d ago. Patient says she was TAR in ED with same complaints and felt better on discharge but, her headache pain returned. She has photophobia and nausea. But, she denies any focal neurologic deficits.   Her pain is 10/10. Feels like multiple previous migraine headaches. 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   Patient is noted to be hypertensive. She reports compliance with meds and denies any recent changes. Denies cocaine use. She has not experiencing cp or sob.    Past Medical History  Diagnosis Date  . Cancer   . Hypertension   . Anemia   . GERD (gastroesophageal reflux disease)   . Tension headache   . Colon cancer   . Migraine     Past Surgical History  Procedure Laterality Date  . Cholecystectomy    . Abdominal hysterectomy    . Colon surgery      No family history on file.  History  Substance Use Topics  . Smoking status: Never Smoker   . Smokeless tobacco: Not on file  . Alcohol Use: No    OB History   Grav Para Term Preterm Abortions TAB SAB Ect Mult Living                  Review of Systems Gen: no weight loss, fevers, chills, night sweats Eyes: no discharge or drainage, no occular pain or visual  changes Nose: no epistaxis or rhinorrhea Mouth: no dental pain, no sore throat Neck: no neck pain Lungs: no SOB, cough, wheezing CV: no chest pain, palpitations, dependent edema or orthopnea Abd: no abdominal pain, nausea, vomiting GU: no dysuria or gross hematuria MSK: no myalgias or arthralgias Neuro: As per history of present illness, otherwise negative Skin: no rash Psyche: negative. Allergies  Nsaids  Home Medications   Current Outpatient Rx  Name  Route  Sig  Dispense  Refill  . butalbital-acetaminophen-caffeine (FIORICET, ESGIC) 50-325-40 MG per tablet   Oral   Take 2 tablets by mouth 2 (two) times daily as needed. For migraines         . esomeprazole (NEXIUM) 40 MG capsule   Oral   Take 40 mg by mouth daily before breakfast.           . ferrous sulfate 325 (65 FE) MG tablet   Oral   Take 325 mg by mouth 3 (three) times daily.          . Nebivolol HCl (BYSTOLIC) 20 MG TABS   Oral   Take 2 tablets by mouth daily.          . Olmesartan-Amlodipine-HCTZ (TRIBENZOR) 40-10-25 MG TABS   Oral   Take 1 tablet by mouth daily.          Marland Kitchen  promethazine (PHENERGAN) 25 MG suppository   Rectal   Place 1 suppository (25 mg total) rectally every 6 (six) hours as needed for nausea.   4 each   0   . promethazine (PHENERGAN) 25 MG tablet   Oral   Take 1 tablet (25 mg total) by mouth every 6 (six) hours as needed for nausea.   12 tablet   0   . traMADol (ULTRAM) 50 MG tablet   Oral   Take 1 tablet (50 mg total) by mouth every 6 (six) hours as needed for pain.   15 tablet   0     BP 194/96  Pulse 102  Temp(Src) 98.2 F (36.8 C) (Oral)  Resp 14  SpO2 97%  Physical Exam Gen: well developed and well nourished appearing Head: NCAT Eyes: PERL, EOMI Nose: no epistaixis or rhinorrhea Mouth/throat: mucosa is moist and pink Neck: supple, no stridor Lungs: CTA B, no wheezing, rhonchi or rales CV: rapid and regular, pulse 100 to 104 bpm, no murmur, ext appear  well perfused.  Abd: soft, notender, nondistended, obese.  Back: no ttp, no cva ttp Skin: no rashese, wnl Neuro: CN ii-xii grossly intact, no focal deficits Psyche; normal affect,  calm and cooperative.   ED Course  Procedures (including critical care time)  Patient with migraine headache. Says this problem usually responds to dilaudid. We are treating with dilaudid, reglan, ivf. Tx HTN with labetalol. Will re-evaluate.    MDM  Patient is feeling better after symptomatic management. We have treated hypertensive urgency which is likely physiologic response to pain. Patient agrees to continue all medications and will f/u with her PCP on Monday for recheck of BP and consideration for alternation to current antihypertensive regimen.         Brandt Loosen, MD 08/25/12 [phone]

## 2012-12-18 ENCOUNTER — Emergency Department (HOSPITAL_BASED_OUTPATIENT_CLINIC_OR_DEPARTMENT_OTHER)
Admission: EM | Admit: 2012-12-18 | Discharge: 2012-12-18 | Disposition: A | Discharge status: Home / Self Care | Payer: Medicaid Other | Attending: Emergency Medicine | Admitting: Emergency Medicine

## 2012-12-18 ENCOUNTER — Encounter (HOSPITAL_BASED_OUTPATIENT_CLINIC_OR_DEPARTMENT_OTHER): Payer: Self-pay | Admitting: Emergency Medicine

## 2012-12-18 DIAGNOSIS — K219 Gastro-esophageal reflux disease without esophagitis: Secondary | ICD-10-CM | POA: Insufficient documentation

## 2012-12-18 DIAGNOSIS — Z85038 Personal history of other malignant neoplasm of large intestine: Secondary | ICD-10-CM | POA: Insufficient documentation

## 2012-12-18 DIAGNOSIS — Z79899 Other long term (current) drug therapy: Secondary | ICD-10-CM | POA: Insufficient documentation

## 2012-12-18 DIAGNOSIS — D649 Anemia, unspecified: Secondary | ICD-10-CM | POA: Insufficient documentation

## 2012-12-18 DIAGNOSIS — I10 Essential (primary) hypertension: Secondary | ICD-10-CM | POA: Insufficient documentation

## 2012-12-18 DIAGNOSIS — H53149 Visual discomfort, unspecified: Secondary | ICD-10-CM | POA: Insufficient documentation

## 2012-12-18 DIAGNOSIS — G43909 Migraine, unspecified, not intractable, without status migrainosus: Secondary | ICD-10-CM | POA: Insufficient documentation

## 2012-12-18 DIAGNOSIS — R1110 Vomiting, unspecified: Secondary | ICD-10-CM | POA: Insufficient documentation

## 2012-12-18 MED ORDER — PROMETHAZINE HCL 25 MG/ML IJ SOLN
25.0000 mg | Freq: Once | INTRAMUSCULAR | Status: AC
  Administered 2012-12-18: 25 mg via INTRAMUSCULAR
  Filled 2012-12-18: qty 1

## 2012-12-18 MED ORDER — HYDROMORPHONE HCL PF 2 MG/ML IJ SOLN
2.0000 mg | Freq: Once | INTRAMUSCULAR | Status: AC
  Administered 2012-12-18: 2 mg via INTRAMUSCULAR
  Filled 2012-12-18: qty 1

## 2012-12-18 MED ORDER — PROMETHAZINE HCL 25 MG/ML IJ SOLN: 25.0000 mg | Freq: Once | INTRAMUSCULAR | Status: DC

## 2012-12-18 NOTE — ED Provider Notes (Signed)
CSN: [id number]     Arrival date & time 12/18/12  1801 History  This chart was scribed for Victoria Sprout, MD by Danella Maiers, ED Scribe. This patient was seen in room MH08/MH08 and the patient's care was started at 6:19 PM.   Chief Complaint  Patient presents with  . Migraine   Patient is a 57 y.o. female presenting with migraines. The history is provided by the patient. No language interpreter was used.  Migraine   HPI Comments: Victoria Sullivan is a 57 y.o. female who presents to the Emergency Department complaining of a worsening left-sided migraine onset two days ago with associated emesis. The emesis started last night and last through the night. She has a h/o migraines and states that dilaudid and phenergan shots have helped in the past. She has an IV in her left upper arm from getting an iron injection yesterday. She states she has gotten iron injections in the past with no problems. She denies fevers.  Past Medical History  Diagnosis Date  . Cancer   . Hypertension   . Anemia   . GERD (gastroesophageal reflux disease)   . Tension headache   . Colon cancer   . Migraine    Past Surgical History  Procedure Laterality Date  . Cholecystectomy    . Abdominal hysterectomy    . Colon surgery     No family history on file. History  Substance Use Topics  . Smoking status: Never Smoker   . Smokeless tobacco: Not on file  . Alcohol Use: No   OB History   Grav Para Term Preterm Abortions TAB SAB Ect Mult Living                 Review of Systems A complete 10 system review of systems was obtained and all systems are negative except as noted in the HPI and PMH.   Allergies  Nsaids  Home Medications   Current Outpatient Rx  Name  Route  Sig  Dispense  Refill  . butalbital-acetaminophen-caffeine (FIORICET, ESGIC) 50-325-40 MG per tablet   Oral   Take 2 tablets by mouth 2 (two) times daily as needed. For migraines         . esomeprazole (NEXIUM) 40 MG capsule  Oral   Take 40 mg by mouth daily before breakfast.           . ferrous sulfate 325 (65 FE) MG tablet   Oral   Take 325 mg by mouth 3 (three) times daily.          . Nebivolol HCl (BYSTOLIC) 20 MG TABS   Oral   Take 2 tablets by mouth daily.          . Olmesartan-Amlodipine-HCTZ (TRIBENZOR) 40-10-25 MG TABS   Oral   Take 1 tablet by mouth daily.          . promethazine (PHENERGAN) 25 MG suppository   Rectal   Place 1 suppository (25 mg total) rectally every 6 (six) hours as needed for nausea.   4 each   0   . promethazine (PHENERGAN) 25 MG tablet   Oral   Take 1 tablet (25 mg total) by mouth every 6 (six) hours as needed for nausea.   12 tablet   0   . traMADol (ULTRAM) 50 MG tablet   Oral   Take 1 tablet (50 mg total) by mouth every 6 (six) hours as needed for pain.   15 tablet   0  BP 223/130  Pulse 127  Temp(Src) 98.7 F (37.1 C) (Oral)  Resp 26  Wt 210 lb (95.255 kg)  BMI 37.21 kg/m2  SpO2 98% Physical Exam  Nursing note and vitals reviewed. Constitutional: She is oriented to person, place, and time. She appears well-developed and well-nourished. No distress.  HENT:  Head: Normocephalic and atraumatic.  Eyes: EOM are normal. Pupils are equal, round, and reactive to light.  Fundoscopic exam:      The right eye shows no papilledema.       The left eye shows no papilledema.  Neck: Normal range of motion. Neck supple.  Cardiovascular: Normal rate, regular rhythm, normal heart sounds and intact distal pulses.  Exam reveals no friction rub.   No murmur heard. Pulmonary/Chest: Effort normal and breath sounds normal. She has no wheezes. She has no rales.  Abdominal: Soft. Bowel sounds are normal. She exhibits no distension. There is no tenderness. There is no rebound and no guarding.  Musculoskeletal: Normal range of motion. She exhibits no tenderness.  No edema  Lymphadenopathy:    She has no cervical adenopathy.  Neurological: She is alert and  oriented to person, place, and time. She has normal strength. No cranial nerve deficit or sensory deficit. Gait normal.  photophobia  Skin: Skin is warm and dry. No rash noted.  Psychiatric: She has a normal mood and affect. Her behavior is normal.    ED Course  Procedures (including critical care time) Medications - No data to display  DIAGNOSTIC STUDIES: Oxygen Saturation is 98% on RA, normal by my interpretation.    COORDINATION OF CARE: 6:27 PM- Discussed treatment plan with pt which includes dilaudid and phenergan injections. Pt agrees to plan.    Labs Review Labs Reviewed - No data to display Imaging Review No results found.  EKG Interpretation   None       MDM  No diagnosis found.  Pt with typical migraine HA without sx suggestive of SAH(sudden onset, worst of life, or deficits), infection, or cavernous vein thrombosis.  Normal neuro exam and vital signs. Pt improved after meds and d/ce home.     I personally performed the services described in this documentation, which was scribed in my presence.  The recorded information has been reviewed and considered.    Victoria Sprout, MD 12/18/12 1906

## 2012-12-18 NOTE — ED Notes (Signed)
Migraine and vomiting. She has an IV in her left upper arm from yesterday when she got a iron injection.

## 2013-02-07 ENCOUNTER — Emergency Department (HOSPITAL_BASED_OUTPATIENT_CLINIC_OR_DEPARTMENT_OTHER)
Admission: EM | Admit: 2013-02-07 | Discharge: 2013-02-07 | Disposition: A | Discharge status: Home / Self Care | Payer: Medicaid Other | Attending: Emergency Medicine | Admitting: Emergency Medicine

## 2013-02-07 ENCOUNTER — Encounter (HOSPITAL_BASED_OUTPATIENT_CLINIC_OR_DEPARTMENT_OTHER): Payer: Self-pay | Admitting: Emergency Medicine

## 2013-02-07 DIAGNOSIS — Z79899 Other long term (current) drug therapy: Secondary | ICD-10-CM | POA: Insufficient documentation

## 2013-02-07 DIAGNOSIS — G43909 Migraine, unspecified, not intractable, without status migrainosus: Secondary | ICD-10-CM | POA: Insufficient documentation

## 2013-02-07 DIAGNOSIS — K219 Gastro-esophageal reflux disease without esophagitis: Secondary | ICD-10-CM | POA: Insufficient documentation

## 2013-02-07 DIAGNOSIS — D649 Anemia, unspecified: Secondary | ICD-10-CM | POA: Insufficient documentation

## 2013-02-07 DIAGNOSIS — Z85038 Personal history of other malignant neoplasm of large intestine: Secondary | ICD-10-CM | POA: Insufficient documentation

## 2013-02-07 DIAGNOSIS — I10 Essential (primary) hypertension: Secondary | ICD-10-CM | POA: Insufficient documentation

## 2013-02-07 MED ORDER — PROMETHAZINE HCL 25 MG/ML IJ SOLN
25.0000 mg | Freq: Once | INTRAMUSCULAR | Status: AC
  Administered 2013-02-07: 25 mg via INTRAMUSCULAR
  Filled 2013-02-07: qty 1

## 2013-02-07 MED ORDER — HYDROMORPHONE HCL PF 2 MG/ML IJ SOLN
2.0000 mg | Freq: Once | INTRAMUSCULAR | Status: AC
  Administered 2013-02-07: 2 mg via INTRAMUSCULAR
  Filled 2013-02-07: qty 1

## 2013-02-07 MED ORDER — HYDROCODONE-ACETAMINOPHEN 5-325 MG PO TABS: 1.0000 | ORAL_TABLET | Freq: Four times a day (QID) | ORAL | Status: DC | PRN

## 2013-02-07 NOTE — ED Provider Notes (Signed)
CSN: [id number]     Arrival date & time 02/07/13  1208 History   First MD Initiated Contact with Patient 02/07/13 1217     Chief Complaint  Patient presents with  . Headache    HPI  Patient presents with headache.  This episode began several days ago.  Since onset headache is been increasing.  The pain is diffuse, throbbing.  There is associated photophobia, nausea, but no vomiting, confusion, disorientation, visual changes.  Patient states that the headache is the same as in numerable prior headaches.  No new characteristics. She has no medication at home for headache relief. She has no primary care physician.   Past Medical History  Diagnosis Date  . Cancer   . Hypertension   . Anemia   . GERD (gastroesophageal reflux disease)   . Tension headache   . Colon cancer   . Migraine    Past Surgical History  Procedure Laterality Date  . Cholecystectomy    . Abdominal hysterectomy    . Colon surgery     No family history on file. History  Substance Use Topics  . Smoking status: Never Smoker   . Smokeless tobacco: Not on file  . Alcohol Use: No   OB History   Grav Para Term Preterm Abortions TAB SAB Ect Mult Living                 Review of Systems  Constitutional:       Per HPI, otherwise negative  HENT:       Per HPI, otherwise negative  Respiratory:       Per HPI, otherwise negative  Cardiovascular:       Per HPI, otherwise negative  Gastrointestinal: Positive for nausea. Negative for vomiting.  Endocrine:       Negative aside from HPI  Genitourinary:       Neg aside from HPI   Musculoskeletal:       Per HPI, otherwise negative  Skin: Negative.   Neurological: Positive for headaches. Negative for seizures, syncope, speech difficulty, light-headedness and numbness.    Allergies  Nsaids  Home Medications   Current Outpatient Rx  Name  Route  Sig  Dispense  Refill  . butalbital-acetaminophen-caffeine (FIORICET, ESGIC) 50-325-40 MG per tablet   Oral  Take 2 tablets by mouth 2 (two) times daily as needed. For migraines         . esomeprazole (NEXIUM) 40 MG capsule   Oral   Take 40 mg by mouth daily before breakfast.           . ferrous sulfate 325 (65 FE) MG tablet   Oral   Take 325 mg by mouth 3 (three) times daily.          . Nebivolol HCl (BYSTOLIC) 20 MG TABS   Oral   Take 2 tablets by mouth daily.          . Olmesartan-Amlodipine-HCTZ (TRIBENZOR) 40-10-25 MG TABS   Oral   Take 1 tablet by mouth daily.          . promethazine (PHENERGAN) 25 MG suppository   Rectal   Place 1 suppository (25 mg total) rectally every 6 (six) hours as needed for nausea.   4 each   0   . promethazine (PHENERGAN) 25 MG tablet   Oral   Take 1 tablet (25 mg total) by mouth every 6 (six) hours as needed for nausea.   12 tablet   0   . traMADol (  ULTRAM) 50 MG tablet   Oral   Take 1 tablet (50 mg total) by mouth every 6 (six) hours as needed for pain.   15 tablet   0    BP 170/89  Pulse 84  Temp(Src) 98.1 F (36.7 C) (Oral)  Resp 22  Ht 5\' 3"  (1.6 m)  Wt 220 lb (99.791 kg)  BMI 38.98 kg/m2  SpO2 100% Physical Exam  Nursing note and vitals reviewed. Constitutional: She is oriented to person, place, and time. She appears well-developed and well-nourished. No distress.  HENT:  Head: Normocephalic and atraumatic.  Eyes: Conjunctivae and EOM are normal.  Cardiovascular: Normal rate and regular rhythm.   Pulmonary/Chest: Effort normal and breath sounds normal. No stridor. No respiratory distress.  Abdominal: She exhibits no distension.  Musculoskeletal: She exhibits no edema.  Neurological: She is alert and oriented to person, place, and time. No cranial nerve deficit. She exhibits normal muscle tone. Coordination normal.  Skin: Skin is warm and dry.  Psychiatric: She has a normal mood and affect.    ED Course  Procedures (including critical care time) Labs Review Labs Reviewed - No data to display Imaging Review No  results found.  EKG Interpretation   None      review of the patient's chart the measures that she seen here a few months for similar headaches, that are characteristic we described as similar to today's episode.  MDM  No diagnosis found. This patient with a history of headaches presents with a characteristically similar episode.  Patient has no significant change from her typical episode, no red flags on exam, and is in no distress.  Patient received analgesia via intramuscular injection here, was discharged with a short course of medication, resources to obtain a new primary care physician.    Gerhard Munch, MD 02/07/13 [id number]

## 2013-02-07 NOTE — ED Notes (Signed)
Headache x 2 days

## 2013-03-31 ENCOUNTER — Encounter (HOSPITAL_BASED_OUTPATIENT_CLINIC_OR_DEPARTMENT_OTHER): Payer: Self-pay | Admitting: Emergency Medicine

## 2013-03-31 ENCOUNTER — Emergency Department (HOSPITAL_BASED_OUTPATIENT_CLINIC_OR_DEPARTMENT_OTHER)
Admission: EM | Admit: 2013-03-31 | Discharge: 2013-03-31 | Disposition: A | Discharge status: Home / Self Care | Payer: Medicaid Other | Attending: Emergency Medicine | Admitting: Emergency Medicine

## 2013-03-31 DIAGNOSIS — I10 Essential (primary) hypertension: Secondary | ICD-10-CM | POA: Insufficient documentation

## 2013-03-31 DIAGNOSIS — K219 Gastro-esophageal reflux disease without esophagitis: Secondary | ICD-10-CM | POA: Insufficient documentation

## 2013-03-31 DIAGNOSIS — D649 Anemia, unspecified: Secondary | ICD-10-CM | POA: Insufficient documentation

## 2013-03-31 DIAGNOSIS — Z79899 Other long term (current) drug therapy: Secondary | ICD-10-CM | POA: Insufficient documentation

## 2013-03-31 DIAGNOSIS — G43909 Migraine, unspecified, not intractable, without status migrainosus: Secondary | ICD-10-CM | POA: Insufficient documentation

## 2013-03-31 DIAGNOSIS — Z85038 Personal history of other malignant neoplasm of large intestine: Secondary | ICD-10-CM | POA: Insufficient documentation

## 2013-03-31 MED ORDER — PROMETHAZINE HCL 25 MG PO TABS: 25.0000 mg | ORAL_TABLET | Freq: Four times a day (QID) | ORAL | Status: DC | PRN

## 2013-03-31 MED ORDER — HYDROMORPHONE HCL PF 2 MG/ML IJ SOLN
2.0000 mg | Freq: Once | INTRAMUSCULAR | Status: AC
  Administered 2013-03-31: 2 mg via INTRAMUSCULAR
  Filled 2013-03-31: qty 1

## 2013-03-31 MED ORDER — PROMETHAZINE HCL 25 MG/ML IJ SOLN
25.0000 mg | Freq: Once | INTRAMUSCULAR | Status: AC
  Administered 2013-03-31: 25 mg via INTRAMUSCULAR
  Filled 2013-03-31: qty 1

## 2013-03-31 MED ORDER — HYDROCODONE-ACETAMINOPHEN 5-325 MG PO TABS: 1.0000 | ORAL_TABLET | Freq: Four times a day (QID) | ORAL | Status: DC | PRN

## 2013-03-31 NOTE — Discharge Instructions (Signed)
Migraine Headache A migraine headache is an intense, throbbing pain on one or both sides of your head. A migraine can last for 30 minutes to several hours. CAUSES  The exact cause of a migraine headache is not always known. However, a migraine may be caused when nerves in the brain become irritated and release chemicals that cause inflammation. This causes pain. Certain things may also trigger migraines, such as:  Alcohol.  Smoking.  Stress.  Menstruation.  Aged cheeses.  Foods or drinks that contain nitrates, glutamate, aspartame, or tyramine.  Lack of sleep.  Chocolate.  Caffeine.  Hunger.  Physical exertion.  Fatigue.  Medicines used to treat chest pain (nitroglycerine), birth control pills, estrogen, and some blood pressure medicines. SIGNS AND SYMPTOMS  Pain on one or both sides of your head.  Pulsating or throbbing pain.  Severe pain that prevents daily activities.  Pain that is aggravated by any physical activity.  Nausea, vomiting, or both.  Dizziness.  Pain with exposure to bright lights, loud noises, or activity.  General sensitivity to bright lights, loud noises, or smells. Before you get a migraine, you may get warning signs that a migraine is coming (aura). An aura may include:  Seeing flashing lights.  Seeing bright spots, halos, or zig-zag lines.  Having tunnel vision or blurred vision.  Having feelings of numbness or tingling.  Having trouble talking.  Having muscle weakness. DIAGNOSIS  A migraine headache is often diagnosed based on:  Symptoms.  Physical exam.  A CT scan or MRI of your head. These imaging tests cannot diagnose migraines, but they can help rule out other causes of headaches. TREATMENT Medicines may be given for pain and nausea. Medicines can also be given to help prevent recurrent migraines.  HOME CARE INSTRUCTIONS  Only take over-the-counter or prescription medicines for pain or discomfort as directed by your  health care provider. The use of long-term narcotics is not recommended.  Lie down in a dark, quiet room when you have a migraine.  Keep a journal to find out what may trigger your migraine headaches. For example, write down:  What you eat and drink.  How much sleep you get.  Any change to your diet or medicines.  Limit alcohol consumption.  Quit smoking if you smoke.  Get 7 9 hours of sleep, or as recommended by your health care provider.  Limit stress.  Keep lights dim if bright lights bother you and make your migraines worse. SEEK IMMEDIATE MEDICAL CARE IF:   Your migraine becomes severe.  You have a fever.  You have a stiff neck.  You have vision loss.  You have muscular weakness or loss of muscle control.  You start losing your balance or have trouble walking.  You feel faint or pass out.  You have severe symptoms that are different from your first symptoms. MAKE SURE YOU:   Understand these instructions.  Will watch your condition.  Will get help right away if you are not doing well or get worse. Document Released: 02/21/2005 Document Revised: 12/12/2012 Document Reviewed: 10/29/2012 [employer] Patient Information 2014 [employer].  

## 2013-03-31 NOTE — ED Notes (Addendum)
Pt c/o "migraine" headache that started on Friday. States she has nausea. Denies vomiting. Denies any weakness. C/o light sensitivity. States similar to previous migraines. No other complaints. Pt has a ride home

## 2013-03-31 NOTE — ED Provider Notes (Signed)
CSN: [id number]     Arrival date & time 03/31/13  4098 History   First MD Initiated Contact with Patient 03/31/13 0554     Chief Complaint  Patient presents with  . Migraine   (Consider location/radiation/quality/duration/timing/severity/associated sxs/prior Treatment) HPI This is a 58 year old female with a history of migraines. She is here with a headache that began 2 days ago. The headache is primarily on the left frontal aspect of the head. It is throbbing and characterizes like previous migraines. She states the pain is severe. It has been associated with nausea but no vomiting there is some photophobia but no focal neurologic deficit. She has had multiple prior visits for similar headaches.  Past Medical History  Diagnosis Date  . Cancer   . Hypertension   . Anemia   . GERD (gastroesophageal reflux disease)   . Tension headache   . Colon cancer   . Migraine    Past Surgical History  Procedure Laterality Date  . Cholecystectomy    . Abdominal hysterectomy    . Colon surgery     No family history on file. History  Substance Use Topics  . Smoking status: Never Smoker   . Smokeless tobacco: Not on file  . Alcohol Use: No   OB History   Grav Para Term Preterm Abortions TAB SAB Ect Mult Living                 Review of Systems  All other systems reviewed and are negative.    Allergies  Nsaids  Home Medications   Current Outpatient Rx  Name  Route  Sig  Dispense  Refill  . esomeprazole (NEXIUM) 40 MG capsule   Oral   Take 40 mg by mouth daily before breakfast.           . ferrous sulfate 325 (65 FE) MG tablet   Oral   Take 325 mg by mouth 3 (three) times daily.          Marland Kitchen HYDROcodone-acetaminophen (NORCO/VICODIN) 5-325 MG per tablet   Oral   Take 1 tablet by mouth every 6 (six) hours as needed.   10 tablet   0   . Nebivolol HCl (BYSTOLIC) 20 MG TABS   Oral   Take 2 tablets by mouth daily.          . Olmesartan-Amlodipine-HCTZ (TRIBENZOR)  40-10-25 MG TABS   Oral   Take 1 tablet by mouth daily.          . promethazine (PHENERGAN) 25 MG suppository   Rectal   Place 1 suppository (25 mg total) rectally every 6 (six) hours as needed for nausea.   4 each   0   . promethazine (PHENERGAN) 25 MG tablet   Oral   Take 1 tablet (25 mg total) by mouth every 6 (six) hours as needed for nausea.   12 tablet   0   . traMADol (ULTRAM) 50 MG tablet   Oral   Take 1 tablet (50 mg total) by mouth every 6 (six) hours as needed for pain.   15 tablet   0    BP 165/115  Pulse 107  Temp(Src) 97.6 F (36.4 C) (Oral)  Resp 16  Ht 5\' 3"  (1.6 m)  Wt 224 lb (101.606 kg)  BMI 39.69 kg/m2  SpO2 100%  Physical Exam General: Well-developed, well-nourished female in no acute distress; appearance consistent with age of record HENT: normocephalic; atraumatic Eyes: pupils equal, round and reactive to light; extraocular  muscles intact; arcus senilis bilaterally Neck: supple Heart: regular rate and rhythm; no murmurs, rubs or gallops Lungs: clear to auscultation bilaterally Abdomen: soft; nondistended; nontender; bowel sounds present Extremities: No deformity; full range of motion; pulses normal; no edema Neurologic: Awake, alert and oriented; motor function intact in all extremities and symmetric; no facial droop Skin: Warm and dry Psychiatric: Normal mood and affect    ED Course  Procedures (including critical care time)  MDM      Wynetta Fines, MD 03/31/13 [phone]

## 2013-06-16 ENCOUNTER — Emergency Department (HOSPITAL_BASED_OUTPATIENT_CLINIC_OR_DEPARTMENT_OTHER)
Admission: EM | Admit: 2013-06-16 | Discharge: 2013-06-16 | Disposition: A | Discharge status: Other Acute Inpatient Hospital | Payer: Medicaid Other | Attending: Emergency Medicine | Admitting: Emergency Medicine

## 2013-06-16 ENCOUNTER — Emergency Department (HOSPITAL_BASED_OUTPATIENT_CLINIC_OR_DEPARTMENT_OTHER): Payer: Medicaid Other

## 2013-06-16 DIAGNOSIS — R51 Headache: Secondary | ICD-10-CM

## 2013-06-16 DIAGNOSIS — R519 Headache, unspecified: Secondary | ICD-10-CM

## 2013-06-16 DIAGNOSIS — Z85038 Personal history of other malignant neoplasm of large intestine: Secondary | ICD-10-CM | POA: Insufficient documentation

## 2013-06-16 DIAGNOSIS — I10 Essential (primary) hypertension: Secondary | ICD-10-CM | POA: Insufficient documentation

## 2013-06-16 DIAGNOSIS — K219 Gastro-esophageal reflux disease without esophagitis: Secondary | ICD-10-CM | POA: Insufficient documentation

## 2013-06-16 DIAGNOSIS — R112 Nausea with vomiting, unspecified: Secondary | ICD-10-CM | POA: Insufficient documentation

## 2013-06-16 DIAGNOSIS — D649 Anemia, unspecified: Secondary | ICD-10-CM | POA: Insufficient documentation

## 2013-06-16 DIAGNOSIS — Z79899 Other long term (current) drug therapy: Secondary | ICD-10-CM | POA: Insufficient documentation

## 2013-06-16 DIAGNOSIS — I160 Hypertensive urgency: Secondary | ICD-10-CM

## 2013-06-16 DIAGNOSIS — G43909 Migraine, unspecified, not intractable, without status migrainosus: Secondary | ICD-10-CM | POA: Insufficient documentation

## 2013-06-16 LAB — BASIC METABOLIC PANEL
BUN: 8 mg/dL (ref 6–23)
CO2: 28 meq/L (ref 19–32)
Calcium: 9.3 mg/dL (ref 8.4–10.5)
Chloride: 102 mEq/L (ref 96–112)
Creatinine, Ser: 0.7 mg/dL (ref 0.50–1.10)
GFR calc Af Amer: >90 mL/min (ref >90)
GLUCOSE: 102 mg/dL — AB (ref 70–99)
POTASSIUM: 4 meq/L (ref 3.7–5.3)
SODIUM: 141 meq/L (ref 137–147)

## 2013-06-16 LAB — CBC
HCT: 43 % (ref 36.0–46.0)
HEMOGLOBIN: 13.7 g/dL (ref 12.0–15.0)
MCH: 27 pg (ref 26.0–34.0)
MCHC: 31.9 g/dL (ref 30.0–36.0)
MCV: 84.8 fL (ref 78.0–100.0)
Platelets: 260 10*3/uL (ref 150–400)
RBC: 5.07 MIL/uL (ref 3.87–5.11)
RDW: 14.8 % (ref 11.5–15.5)
WBC: 5.9 10*3/uL (ref 4.0–10.5)

## 2013-06-16 MED ORDER — PROMETHAZINE HCL 25 MG PO TABS
25.0000 mg | ORAL_TABLET | Freq: Once | ORAL | Status: AC
  Administered 2013-06-16: 25 mg via ORAL
  Filled 2013-06-16: qty 1

## 2013-06-16 MED ORDER — LABETALOL HCL 5 MG/ML IV SOLN: 20.0000 mg | Freq: Once | INTRAVENOUS | Status: AC

## 2013-06-16 MED ORDER — LABETALOL HCL 5 MG/ML IV SOLN
20.0000 mg | Freq: Once | INTRAVENOUS | Status: DC
Start: 2013-06-16 — End: 2013-06-16

## 2013-06-16 MED ORDER — LABETALOL HCL 5 MG/ML IV SOLN
INTRAVENOUS | Status: AC
  Administered 2013-06-16: 20 mg via INTRAVENOUS
  Filled 2013-06-16: qty 4

## 2013-06-16 MED ORDER — METOCLOPRAMIDE HCL 5 MG/ML IJ SOLN
10.0000 mg | Freq: Once | INTRAMUSCULAR | Status: AC
  Administered 2013-06-16: 10 mg via INTRAVENOUS
  Filled 2013-06-16: qty 2

## 2013-06-16 MED ORDER — DIPHENHYDRAMINE HCL 50 MG/ML IJ SOLN
25.0000 mg | Freq: Once | INTRAMUSCULAR | Status: AC
Start: 2013-06-16 — End: 2013-06-16
  Administered 2013-06-16: 25 mg via INTRAVENOUS
  Filled 2013-06-16: qty 1

## 2013-06-16 MED ORDER — DEXAMETHASONE SODIUM PHOSPHATE 4 MG/ML IJ SOLN
10.0000 mg | Freq: Once | INTRAMUSCULAR | Status: AC
  Administered 2013-06-16: 10 mg via INTRAVENOUS
  Filled 2013-06-16: qty 3

## 2013-06-16 MED ORDER — HYDROMORPHONE HCL PF 1 MG/ML IJ SOLN
1.0000 mg | Freq: Once | INTRAMUSCULAR | Status: AC
  Administered 2013-06-16: 1 mg via INTRAVENOUS
  Filled 2013-06-16: qty 1

## 2013-06-16 MED ORDER — HYDROMORPHONE HCL PF 2 MG/ML IJ SOLN
2.0000 mg | Freq: Once | INTRAMUSCULAR | Status: AC
  Administered 2013-06-16: 2 mg via INTRAMUSCULAR
  Filled 2013-06-16: qty 1

## 2013-06-16 NOTE — ED Notes (Signed)
Patient here with increasing frontal headache x 3 days, took Fioricet with no relief. Has vomited with same, positive photophobia. Patient denies trauma, alert and oriented. States that her BP is always this high when she gets a headache, has taken her BP meds this am.

## 2013-06-16 NOTE — ED Provider Notes (Signed)
CSN: [id number]     Arrival date & time 06/16/13  1000 History   First MD Initiated Contact with Patient 06/16/13 1030     No chief complaint on file.    (Consider location/radiation/quality/duration/timing/severity/associated sxs/prior Treatment) Patient is a 58 y.o. female presenting with headaches. The history is provided by the patient.  Headache Pain location:  L temporal and frontal Quality:  Sharp and dull Radiates to:  Does not radiate Severity currently:  8/10 Severity at highest:  6/10 Onset quality:  Gradual Duration:  2 days Timing:  Constant Progression:  Unchanged Chronicity:  Recurrent Similar to prior headaches: yes   Context comment:  Began at rest Relieved by:  Nothing Worsened by:  Activity, light and sound Associated symptoms: nausea and vomiting   Associated symptoms: no abdominal pain, no cough and no fever     Past Medical History  Diagnosis Date  . Cancer   . Hypertension   . Anemia   . GERD (gastroesophageal reflux disease)   . Tension headache   . Colon cancer   . Migraine    Past Surgical History  Procedure Laterality Date  . Cholecystectomy    . Abdominal hysterectomy    . Colon surgery     No family history on file. History  Substance Use Topics  . Smoking status: Never Smoker   . Smokeless tobacco: Not on file  . Alcohol Use: No   OB History   Grav Para Term Preterm Abortions TAB SAB Ect Mult Living                 Review of Systems  Constitutional: Negative for fever and chills.  Respiratory: Negative for cough and shortness of breath.   Gastrointestinal: Positive for nausea and vomiting. Negative for abdominal pain.  Neurological: Positive for headaches.  All other systems reviewed and are negative.     Allergies  Nsaids  Home Medications   Current Outpatient Rx  Name  Route  Sig  Dispense  Refill  . captopril (CAPOTEN) 25 MG tablet   Oral   Take 25 mg by mouth 3 (three) times daily.         . cloNIDine  (CATAPRES) 0.2 MG tablet   Oral   Take 0.2 mg by mouth 2 (two) times daily.         . furosemide (LASIX) 20 MG tablet   Oral   Take 20 mg by mouth.         . metoprolol (LOPRESSOR) 50 MG tablet   Oral   Take 50 mg by mouth 2 (two) times daily.         Marland Kitchen esomeprazole (NEXIUM) 40 MG capsule   Oral   Take 40 mg by mouth daily before breakfast.           . ferrous sulfate 325 (65 FE) MG tablet   Oral   Take 325 mg by mouth 3 (three) times daily.          . promethazine (PHENERGAN) 25 MG tablet   Oral   Take 1 tablet (25 mg total) by mouth every 6 (six) hours as needed for nausea.   12 tablet   0    BP 223/109  Pulse 96  Temp(Src) 97.8 F (36.6 C) (Oral)  Resp 18  Ht 5\' 3"  (1.6 m)  Wt 223 lb (101.152 kg)  BMI 39.51 kg/m2  SpO2 100% Physical Exam  Nursing note and vitals reviewed. Constitutional: She is oriented to person, place,  and time. She appears well-developed and well-nourished. No distress.  HENT:  Head: Normocephalic and atraumatic.  Eyes: EOM are normal. Pupils are equal, round, and reactive to light.  Neck: Normal range of motion. Neck supple.  Cardiovascular: Normal rate and regular rhythm.  Exam reveals no friction rub.   No murmur heard. Pulmonary/Chest: Effort normal and breath sounds normal. No respiratory distress. She has no wheezes. She has no rales.  Abdominal: Soft. She exhibits no distension. There is no tenderness. There is no rebound.  Musculoskeletal: Normal range of motion. She exhibits no edema.  Neurological: She is alert and oriented to person, place, and time. No cranial nerve deficit. She exhibits normal muscle tone.  Skin: No rash noted. She is not diaphoretic.    ED Course  Procedures (including critical care time) Labs Review Labs Reviewed - No data to display Imaging Review No results found.   EKG Interpretation   Date/Time:  Sunday June 16 2013 13:36:21 EDT Ventricular Rate:  83 PR Interval:  140 QRS Duration:  88 QT Interval:  396 QTC Calculation: 465 R Axis:   34 Text Interpretation:  Normal sinus rhythm Right atrial enlargement  Borderline ECG Similar to prior Confirmed by Mingo Amber  MD, [place] ([id number]) on  06/16/2013 1:40:44 PM      Angiocath insertion Performed by: Osvaldo Shipper  Consent: Verbal consent obtained. Risks and benefits: risks, benefits and alternatives were discussed Time out: Immediately prior to procedure a "time out" was called to verify the correct patient, procedure, equipment, support staff and site/side marked as required.  Preparation: Patient was prepped and draped in the usual sterile fashion.  Vein Location: R AC  Yes Ultrasound Guided  Gauge: 20g  Normal blood return and flush without difficulty Patient tolerance: Patient tolerated the procedure well with no immediate complications.    MDM   Final diagnoses:  Headache  Hypertensive urgency    58 year old female presents with headache. History of multiple migraines on the left side, feels like similar. Elevated blood pressure here, has taken her blood pressure medicines this morning. Had high blood pressure associated with her headaches. Initial blood pressure is 220 46/65/9935 systolic, my initial exam, 187/108, so it is improving. History of colon cancer, that head CT as recently as one year ago that was normal. States normally has headaches and is the mediastinum. Gets Dilaudid. Denies vision changes, numbness, weakness. Associated photophobia, phonophobia. Here cranial nerves intact, pupils regular and reactive. Lungs clear, belly benign. Normal strength and sensation in extremities. We'll treat with Dilaudid, by mouth Phenergan. Difficulty with IV access. We'll scan had to ensure no metastatic disease. No temporal tenderness, no concern for GCA. No improvement after IM dilaudid, PO phenergan. Will give IV headache cocktail, give BP meds at BP now 201/110s, will order labs, EKG to look for possible end  organ damage from her HTN. Labs ok. HA improving after BP meds, BPs in the 701X-793J systolic. Patient stable for admssion, admited to [facility].  Osvaldo Shipper, MD 06/16/13 [phone]

## 2013-07-15 ENCOUNTER — Encounter (HOSPITAL_BASED_OUTPATIENT_CLINIC_OR_DEPARTMENT_OTHER): Payer: Self-pay | Admitting: Emergency Medicine

## 2013-07-15 ENCOUNTER — Emergency Department (HOSPITAL_BASED_OUTPATIENT_CLINIC_OR_DEPARTMENT_OTHER)
Admission: EM | Admit: 2013-07-15 | Discharge: 2013-07-15 | Disposition: A | Discharge status: Home / Self Care | Payer: Medicaid Other | Attending: Emergency Medicine | Admitting: Emergency Medicine

## 2013-07-15 DIAGNOSIS — R519 Headache, unspecified: Secondary | ICD-10-CM

## 2013-07-15 DIAGNOSIS — K219 Gastro-esophageal reflux disease without esophagitis: Secondary | ICD-10-CM | POA: Insufficient documentation

## 2013-07-15 DIAGNOSIS — I10 Essential (primary) hypertension: Secondary | ICD-10-CM | POA: Insufficient documentation

## 2013-07-15 DIAGNOSIS — Z85038 Personal history of other malignant neoplasm of large intestine: Secondary | ICD-10-CM | POA: Insufficient documentation

## 2013-07-15 DIAGNOSIS — D649 Anemia, unspecified: Secondary | ICD-10-CM | POA: Insufficient documentation

## 2013-07-15 DIAGNOSIS — Z79899 Other long term (current) drug therapy: Secondary | ICD-10-CM | POA: Insufficient documentation

## 2013-07-15 DIAGNOSIS — G43909 Migraine, unspecified, not intractable, without status migrainosus: Secondary | ICD-10-CM | POA: Insufficient documentation

## 2013-07-15 DIAGNOSIS — R51 Headache: Secondary | ICD-10-CM

## 2013-07-15 MED ORDER — PROMETHAZINE HCL 25 MG PO TABS: 25.0000 mg | ORAL_TABLET | Freq: Four times a day (QID) | ORAL | Status: DC | PRN

## 2013-07-15 MED ORDER — HYDROCODONE-ACETAMINOPHEN 5-325 MG PO TABS: 2.0000 | ORAL_TABLET | ORAL | Status: DC | PRN

## 2013-07-15 MED ORDER — HYDROMORPHONE HCL PF 2 MG/ML IJ SOLN
2.0000 mg | Freq: Once | INTRAMUSCULAR | Status: AC
  Administered 2013-07-15: 2 mg via INTRAMUSCULAR
  Filled 2013-07-15: qty 1

## 2013-07-15 MED ORDER — PROMETHAZINE HCL 25 MG/ML IJ SOLN
25.0000 mg | Freq: Once | INTRAMUSCULAR | Status: AC
Start: 2013-07-15 — End: 2013-07-15
  Administered 2013-07-15: 25 mg via INTRAMUSCULAR
  Filled 2013-07-15: qty 1

## 2013-07-15 NOTE — ED Provider Notes (Signed)
Medical screening examination/treatment/procedure(s) were performed by non-physician practitioner and as supervising physician I was immediately available for consultation/collaboration.   EKG Interpretation None        Blanchie Dessert, MD 07/15/13 1517

## 2013-07-15 NOTE — Discharge Instructions (Signed)

## 2013-07-15 NOTE — ED Provider Notes (Signed)
CSN: [id number]     Arrival date & time 07/15/13  1044 History   First MD Initiated Contact with Patient 07/15/13 1052     Chief Complaint  Patient presents with  . Headache     (Consider location/radiation/quality/duration/timing/severity/associated sxs/prior Treatment) Patient is a 58 y.o. female presenting with headaches. The history is provided by the patient. No language interpreter was used.  Headache Pain location:  Generalized Radiates to:  Does not radiate Severity currently:  10/10 Severity at highest:  10/10 Timing:  Constant Progression:  Worsening Similar to prior headaches: no   Context: not activity   Relieved by:  Nothing Worsened by:  Nothing tried Ineffective treatments:  None tried Associated symptoms: vomiting   Associated symptoms: no abdominal pain, no congestion and no cough   Pt has frequent headaches.   Pt also worried about bp  Past Medical History  Diagnosis Date  . Cancer   . Hypertension   . Anemia   . GERD (gastroesophageal reflux disease)   . Tension headache   . Colon cancer   . Migraine    Past Surgical History  Procedure Laterality Date  . Cholecystectomy    . Abdominal hysterectomy    . Colon surgery     No family history on file. History  Substance Use Topics  . Smoking status: Never Smoker   . Smokeless tobacco: Not on file  . Alcohol Use: No   OB History   Grav Para Term Preterm Abortions TAB SAB Ect Mult Living                 Review of Systems  HENT: Negative for congestion.   Respiratory: Negative for cough.   Gastrointestinal: Positive for vomiting. Negative for abdominal pain.  Neurological: Positive for headaches.  All other systems reviewed and are negative.     Allergies  Nsaids  Home Medications   Prior to Admission medications   Medication Sig Start Date End Date Taking? Authorizing Provider  captopril (CAPOTEN) 25 MG tablet Take 25 mg by mouth 3 (three) times daily.    Historical Provider, MD   cloNIDine (CATAPRES) 0.2 MG tablet Take 0.2 mg by mouth 2 (two) times daily.    Historical Provider, MD  esomeprazole (NEXIUM) 40 MG capsule Take 40 mg by mouth daily before breakfast.      Historical Provider, MD  ferrous sulfate 325 (65 FE) MG tablet Take 325 mg by mouth 3 (three) times daily.     Historical Provider, MD  furosemide (LASIX) 20 MG tablet Take 20 mg by mouth.    Historical Provider, MD  metoprolol (LOPRESSOR) 50 MG tablet Take 50 mg by mouth 2 (two) times daily.    Historical Provider, MD  promethazine (PHENERGAN) 25 MG tablet Take 1 tablet (25 mg total) by mouth every 6 (six) hours as needed for nausea. 08/19/12   Kaitlyn Szekalski, PA-C   BP 171/85  Pulse 90  Temp(Src) 98.1 F (36.7 C) (Oral)  Resp 20  Ht 5\' 3"  (1.6 m)  Wt 220 lb (99.791 kg)  BMI 38.98 kg/m2  SpO2 100% Physical Exam  Nursing note and vitals reviewed. Constitutional: She is oriented to person, place, and time. She appears well-developed and well-nourished.  HENT:  Head: Normocephalic.  Eyes: EOM are normal.  Neck: Normal range of motion.  Cardiovascular: Normal rate and normal heart sounds.   Pulmonary/Chest: Effort normal.  Abdominal: She exhibits no distension.  Musculoskeletal: Normal range of motion.  Neurological: She is alert and  oriented to person, place, and time. She displays normal reflexes. No cranial nerve deficit. She exhibits normal muscle tone. Coordination normal.  Skin: Skin is warm.  Psychiatric: She has a normal mood and affect.    ED Course  Procedures (including critical care time) Labs Review Labs Reviewed - No data to display  Imaging Review No results found.   EKG Interpretation None      MDM   Final diagnoses:  Headache  HTN (hypertension)    Pt given injection of dilaudid and phenergan.   Pt counseled on blood pressure management   Fransico Meadow, PA-C 07/15/13 [place], [place] 07/15/13 1156

## 2013-07-15 NOTE — ED Notes (Signed)
Here for headache 10/10, onset Saturday, headache at left frontal to left temporal with nausea and mild photosensitivity.  Denies vomiting, dizziness. Previous hx of same. No neurologist. Unconfirmed migraine.

## 2013-08-06 ENCOUNTER — Emergency Department (HOSPITAL_BASED_OUTPATIENT_CLINIC_OR_DEPARTMENT_OTHER)
Admission: EM | Admit: 2013-08-06 | Discharge: 2013-08-06 | Disposition: A | Discharge status: Home / Self Care | Payer: Medicaid Other | Attending: Emergency Medicine | Admitting: Emergency Medicine

## 2013-08-06 ENCOUNTER — Encounter (HOSPITAL_BASED_OUTPATIENT_CLINIC_OR_DEPARTMENT_OTHER): Payer: Self-pay | Admitting: Emergency Medicine

## 2013-08-06 DIAGNOSIS — K219 Gastro-esophageal reflux disease without esophagitis: Secondary | ICD-10-CM | POA: Insufficient documentation

## 2013-08-06 DIAGNOSIS — G43909 Migraine, unspecified, not intractable, without status migrainosus: Secondary | ICD-10-CM | POA: Insufficient documentation

## 2013-08-06 DIAGNOSIS — Z8669 Personal history of other diseases of the nervous system and sense organs: Secondary | ICD-10-CM | POA: Insufficient documentation

## 2013-08-06 DIAGNOSIS — E669 Obesity, unspecified: Secondary | ICD-10-CM | POA: Insufficient documentation

## 2013-08-06 DIAGNOSIS — I10 Essential (primary) hypertension: Secondary | ICD-10-CM | POA: Insufficient documentation

## 2013-08-06 DIAGNOSIS — Z79899 Other long term (current) drug therapy: Secondary | ICD-10-CM | POA: Insufficient documentation

## 2013-08-06 DIAGNOSIS — D649 Anemia, unspecified: Secondary | ICD-10-CM | POA: Insufficient documentation

## 2013-08-06 DIAGNOSIS — Z85038 Personal history of other malignant neoplasm of large intestine: Secondary | ICD-10-CM | POA: Insufficient documentation

## 2013-08-06 MED ORDER — HYDROMORPHONE HCL PF 1 MG/ML IJ SOLN
1.0000 mg | Freq: Once | INTRAMUSCULAR | Status: AC
  Administered 2013-08-06: 1 mg via INTRAMUSCULAR
  Filled 2013-08-06: qty 1

## 2013-08-06 MED ORDER — PROMETHAZINE HCL 25 MG/ML IJ SOLN
25.0000 mg | Freq: Once | INTRAMUSCULAR | Status: AC
  Administered 2013-08-06: 25 mg via INTRAVENOUS
  Filled 2013-08-06: qty 1

## 2013-08-06 NOTE — ED Notes (Signed)
Onset of headache Sunday

## 2013-08-06 NOTE — Discharge Instructions (Signed)
Migraine Headache A migraine headache is an intense, throbbing pain on one or both sides of your head. A migraine can last for 30 minutes to several hours. CAUSES  The exact cause of a migraine headache is not always known. However, a migraine may be caused when nerves in the brain become irritated and release chemicals that cause inflammation. This causes pain. Certain things may also trigger migraines, such as:  Alcohol.  Smoking.  Stress.  Menstruation.  Aged cheeses.  Foods or drinks that contain nitrates, glutamate, aspartame, or tyramine.  Lack of sleep.  Chocolate.  Caffeine.  Hunger.  Physical exertion.  Fatigue.  Medicines used to treat chest pain (nitroglycerine), birth control pills, estrogen, and some blood pressure medicines. SIGNS AND SYMPTOMS  Pain on one or both sides of your head.  Pulsating or throbbing pain.  Severe pain that prevents daily activities.  Pain that is aggravated by any physical activity.  Nausea, vomiting, or both.  Dizziness.  Pain with exposure to bright lights, loud noises, or activity.  General sensitivity to bright lights, loud noises, or smells. Before you get a migraine, you may get warning signs that a migraine is coming (aura). An aura may include:  Seeing flashing lights.  Seeing bright spots, halos, or zig-zag lines.  Having tunnel vision or blurred vision.  Having feelings of numbness or tingling.  Having trouble talking.  Having muscle weakness. DIAGNOSIS  A migraine headache is often diagnosed based on:  Symptoms.  Physical exam.  A CT scan or MRI of your head. These imaging tests cannot diagnose migraines, but they can help rule out other causes of headaches. TREATMENT Medicines may be given for pain and nausea. Medicines can also be given to help prevent recurrent migraines.  HOME CARE INSTRUCTIONS  Only take over-the-counter or prescription medicines for pain or discomfort as directed by your  health care provider. The use of long-term narcotics is not recommended.  Lie down in a dark, quiet room when you have a migraine.  Keep a journal to find out what may trigger your migraine headaches. For example, write down:  What you eat and drink.  How much sleep you get.  Any change to your diet or medicines.  Limit alcohol consumption.  Quit smoking if you smoke.  Get 7 9 hours of sleep, or as recommended by your health care provider.  Limit stress.  Keep lights dim if bright lights bother you and make your migraines worse. SEEK IMMEDIATE MEDICAL CARE IF:   Your migraine becomes severe.  You have a fever.  You have a stiff neck.  You have vision loss.  You have muscular weakness or loss of muscle control.  You start losing your balance or have trouble walking.  You feel faint or pass out.  You have severe symptoms that are different from your first symptoms. MAKE SURE YOU:   Understand these instructions.  Will watch your condition.  Will get help right away if you are not doing well or get worse. Document Released: 02/21/2005 Document Revised: 12/12/2012 Document Reviewed: 10/29/2012 [employer] Patient Information 2014 [employer].  

## 2013-08-06 NOTE — ED Notes (Signed)
MD at bedside. 

## 2013-08-06 NOTE — ED Provider Notes (Signed)
CSN: [id number]     Arrival date & time 08/06/13  1145 History   First MD Initiated Contact with Patient 08/06/13 1205     Chief Complaint  Patient presents with  . Headache     (Consider location/radiation/quality/duration/timing/severity/associated sxs/prior Treatment) HPI Comments: Patient states pain is like previous headaches and gets im phenergan and dilaudid then is able to sleep and pain resolves.    Patient is a 58 y.o. female presenting with headaches. The history is provided by the patient.  Headache Pain location:  Frontal Quality:  Unable to specify Radiates to:  Does not radiate Severity currently:  5/10 Severity at highest:  5/10 Onset quality:  Unable to specify Duration:  2 days Timing:  Constant Progression:  Unchanged Chronicity:  Recurrent Similar to prior headaches: yes   Context: not activity, not exposure to bright light, not defecating and not intercourse   Relieved by:  Nothing Worsened by:  Nothing tried Ineffective treatments:  None tried Associated symptoms: no back pain, no blurred vision, no congestion, no cough, no drainage, no ear pain, no fever, no focal weakness, no hearing loss, no loss of balance, no nausea, no neck stiffness, no photophobia, no sinus pressure and no vomiting   Risk factors: no family hx of SAH     Past Medical History  Diagnosis Date  . Cancer   . Hypertension   . Anemia   . GERD (gastroesophageal reflux disease)   . Tension headache   . Colon cancer   . Migraine    Past Surgical History  Procedure Laterality Date  . Cholecystectomy    . Abdominal hysterectomy    . Colon surgery     No family history on file. History  Substance Use Topics  . Smoking status: Never Smoker   . Smokeless tobacco: Not on file  . Alcohol Use: No   OB History   Grav Para Term Preterm Abortions TAB SAB Ect Mult Living                 Review of Systems  Constitutional: Negative for fever.  HENT: Negative for congestion, ear  pain, hearing loss, postnasal drip and sinus pressure.   Eyes: Negative for blurred vision and photophobia.  Respiratory: Negative for cough.   Gastrointestinal: Negative for nausea and vomiting.  Musculoskeletal: Negative for back pain and neck stiffness.  Neurological: Positive for headaches. Negative for focal weakness and loss of balance.      Allergies  Nsaids  Home Medications   Prior to Admission medications   Medication Sig Start Date End Date Taking? Authorizing Provider  AmLODIPine Besylate (NORVASC PO) Take by mouth.   Yes Historical Provider, MD  captopril (CAPOTEN) 25 MG tablet Take 25 mg by mouth 3 (three) times daily.    Historical Provider, MD  cloNIDine (CATAPRES) 0.2 MG tablet Take 0.2 mg by mouth 2 (two) times daily.    Historical Provider, MD  esomeprazole (NEXIUM) 40 MG capsule Take 40 mg by mouth daily before breakfast.      Historical Provider, MD  ferrous sulfate 325 (65 FE) MG tablet Take 325 mg by mouth 3 (three) times daily.     Historical Provider, MD  furosemide (LASIX) 20 MG tablet Take 20 mg by mouth.    Historical Provider, MD  HYDROcodone-acetaminophen (NORCO/VICODIN) 5-325 MG per tablet Take 2 tablets by mouth every 4 (four) hours as needed. 07/15/13   Fransico Meadow, PA-C  metoprolol (LOPRESSOR) 50 MG tablet Take 50 mg by  mouth 2 (two) times daily.    Historical Provider, MD  promethazine (PHENERGAN) 25 MG tablet Take 1 tablet (25 mg total) by mouth every 6 (six) hours as needed for nausea. 08/19/12   Kaitlyn Szekalski, PA-C  promethazine (PHENERGAN) 25 MG tablet Take 1 tablet (25 mg total) by mouth every 6 (six) hours as needed for nausea or vomiting. 07/15/13   Fransico Meadow, PA-C   BP 136/90  Pulse 85  Temp(Src) 98.5 F (36.9 C) (Oral)  Resp 16  Ht 5\' 3"  (1.6 m)  Wt 220 lb (99.791 kg)  BMI 38.98 kg/m2  SpO2 99% Physical Exam  Nursing note and vitals reviewed. Constitutional: She is oriented to person, place, and time. She appears  well-developed and well-nourished.  obese  HENT:  Head: Normocephalic and atraumatic.  Right Ear: Tympanic membrane and external ear normal.  Left Ear: Tympanic membrane and external ear normal.  Nose: Nose normal. Right sinus exhibits no maxillary sinus tenderness and no frontal sinus tenderness. Left sinus exhibits no maxillary sinus tenderness and no frontal sinus tenderness.  Eyes: Conjunctivae and EOM are normal. Pupils are equal, round, and reactive to light. Right eye exhibits no nystagmus. Left eye exhibits no nystagmus.  Neck: Normal range of motion. Neck supple.  Cardiovascular: Normal rate, regular rhythm, normal heart sounds and intact distal pulses.   Pulmonary/Chest: Effort normal and breath sounds normal. No respiratory distress. She exhibits no tenderness.  Abdominal: Soft. Bowel sounds are normal. She exhibits no distension and no mass. There is no tenderness.  Musculoskeletal: Normal range of motion. She exhibits no edema and no tenderness.  Neurological: She is alert and oriented to person, place, and time. She has normal strength and normal reflexes. No sensory deficit. She displays a negative Romberg sign. GCS eye subscore is 4. GCS verbal subscore is 5. GCS motor subscore is 6.  Reflex Scores:      Tricep reflexes are 2+ on the right side and 2+ on the left side.      Bicep reflexes are 2+ on the right side and 2+ on the left side.      Brachioradialis reflexes are 2+ on the right side and 2+ on the left side.      Patellar reflexes are 2+ on the right side and 2+ on the left side.      Achilles reflexes are 2+ on the right side and 2+ on the left side. Patient with normal gait without ataxia, shuffling, spasm, or antalgia. Speech is normal without dysarthria, dysphasia, or aphasia. Muscle strength is 5/5 in bilateral shoulders, elbow flexor and extensors, wrist flexor and extensors, and intrinsic hand muscles. 5/5 bilateral lower extremity hip flexors, extensors, knee  flexors and extensors, and ankle dorsi and plantar flexors.    Skin: Skin is warm and dry. No rash noted.  Psychiatric: She has a normal mood and affect. Her behavior is normal. Judgment and thought content normal.    ED Course  Procedures (including critical care time) Labs Review Labs Reviewed - No data to display  Imaging Review No results found.   EKG Interpretation None      MDM   Final diagnoses:  Migraine      Shaune Pollack, MD 08/07/13 [phone]

## 2013-08-06 NOTE — ED Notes (Signed)
C/o intermittent HA x 2-3 weeks

## 2013-11-02 ENCOUNTER — Encounter (HOSPITAL_BASED_OUTPATIENT_CLINIC_OR_DEPARTMENT_OTHER): Payer: Self-pay | Admitting: Emergency Medicine

## 2013-11-02 ENCOUNTER — Emergency Department (HOSPITAL_BASED_OUTPATIENT_CLINIC_OR_DEPARTMENT_OTHER): Payer: Medicaid Other

## 2013-11-02 ENCOUNTER — Emergency Department (HOSPITAL_BASED_OUTPATIENT_CLINIC_OR_DEPARTMENT_OTHER)
Admission: EM | Admit: 2013-11-02 | Discharge: 2013-11-02 | Disposition: A | Discharge status: Home / Self Care | Payer: Medicaid Other | Attending: Emergency Medicine | Admitting: Emergency Medicine

## 2013-11-02 DIAGNOSIS — Z79899 Other long term (current) drug therapy: Secondary | ICD-10-CM | POA: Diagnosis not present

## 2013-11-02 DIAGNOSIS — Z9089 Acquired absence of other organs: Secondary | ICD-10-CM | POA: Diagnosis not present

## 2013-11-02 DIAGNOSIS — Z85038 Personal history of other malignant neoplasm of large intestine: Secondary | ICD-10-CM | POA: Insufficient documentation

## 2013-11-02 DIAGNOSIS — R110 Nausea: Secondary | ICD-10-CM | POA: Diagnosis not present

## 2013-11-02 DIAGNOSIS — Z90710 Acquired absence of both cervix and uterus: Secondary | ICD-10-CM | POA: Diagnosis not present

## 2013-11-02 DIAGNOSIS — D649 Anemia, unspecified: Secondary | ICD-10-CM | POA: Diagnosis not present

## 2013-11-02 DIAGNOSIS — K219 Gastro-esophageal reflux disease without esophagitis: Secondary | ICD-10-CM | POA: Insufficient documentation

## 2013-11-02 DIAGNOSIS — I10 Essential (primary) hypertension: Secondary | ICD-10-CM | POA: Insufficient documentation

## 2013-11-02 DIAGNOSIS — R109 Unspecified abdominal pain: Secondary | ICD-10-CM | POA: Diagnosis not present

## 2013-11-02 DIAGNOSIS — E669 Obesity, unspecified: Secondary | ICD-10-CM | POA: Insufficient documentation

## 2013-11-02 DIAGNOSIS — G43909 Migraine, unspecified, not intractable, without status migrainosus: Secondary | ICD-10-CM | POA: Insufficient documentation

## 2013-11-02 LAB — COMPREHENSIVE METABOLIC PANEL
ALBUMIN: 4 g/dL (ref 3.5–5.2)
ALK PHOS: 150 U/L — AB (ref 39–117)
ALT: 35 U/L (ref 0–35)
AST: 28 U/L (ref 0–37)
Anion gap: 13 (ref 5–15)
BUN: 25 mg/dL — ABNORMAL HIGH (ref 6–23)
CHLORIDE: 104 meq/L (ref 96–112)
CO2: 25 mEq/L (ref 19–32)
CREATININE: 0.9 mg/dL (ref 0.50–1.10)
Calcium: 9.8 mg/dL (ref 8.4–10.5)
GFR calc Af Amer: 80 mL/min — ABNORMAL LOW (ref >90)
GFR calc non Af Amer: 69 mL/min — ABNORMAL LOW (ref >90)
Glucose, Bld: 100 mg/dL — ABNORMAL HIGH (ref 70–99)
POTASSIUM: 4.2 meq/L (ref 3.7–5.3)
Sodium: 142 mEq/L (ref 137–147)
Total Protein: 7.6 g/dL (ref 6.0–8.3)

## 2013-11-02 LAB — CBC WITH DIFFERENTIAL/PLATELET
BASOS ABS: 0 10*3/uL (ref 0.0–0.1)
Basophils Relative: 1 % (ref 0–1)
Eosinophils Absolute: 0.1 10*3/uL (ref 0.0–0.7)
Eosinophils Relative: 3 % (ref 0–5)
HCT: 41 % (ref 36.0–46.0)
Hemoglobin: 13.2 g/dL (ref 12.0–15.0)
Lymphocytes Relative: 27 % (ref 12–46)
Lymphs Abs: 1.1 10*3/uL (ref 0.7–4.0)
MCH: 27.6 pg (ref 26.0–34.0)
MCHC: 32.2 g/dL (ref 30.0–36.0)
MCV: 85.8 fL (ref 78.0–100.0)
Monocytes Absolute: 0.5 10*3/uL (ref 0.1–1.0)
Monocytes Relative: 13 % — ABNORMAL HIGH (ref 3–12)
NEUTROS ABS: 2.3 10*3/uL (ref 1.7–7.7)
NEUTROS PCT: 56 % (ref 43–77)
Platelets: 186 10*3/uL (ref 150–400)
RBC: 4.78 MIL/uL (ref 3.87–5.11)
RDW: 15.6 % — AB (ref 11.5–15.5)
WBC: 4 10*3/uL (ref 4.0–10.5)

## 2013-11-02 LAB — URINALYSIS, ROUTINE W REFLEX MICROSCOPIC
BILIRUBIN URINE: NEGATIVE
Glucose, UA: NEGATIVE mg/dL
Hgb urine dipstick: NEGATIVE
Ketones, ur: NEGATIVE mg/dL
Leukocytes, UA: NEGATIVE
NITRITE: NEGATIVE
PH: 5.5 (ref 5.0–8.0)
Protein, ur: NEGATIVE mg/dL
SPECIFIC GRAVITY, URINE: 1.025 (ref 1.005–1.030)
Urobilinogen, UA: 0.2 mg/dL (ref 0.0–1.0)

## 2013-11-02 LAB — LIPASE, BLOOD: LIPASE: 14 U/L (ref 11–59)

## 2013-11-02 LAB — OCCULT BLOOD X 1 CARD TO LAB, STOOL: Fecal Occult Bld: NEGATIVE

## 2013-11-02 MED ORDER — PANTOPRAZOLE SODIUM 40 MG IV SOLR
40.0000 mg | Freq: Once | INTRAVENOUS | Status: AC
  Administered 2013-11-02: 40 mg via INTRAVENOUS
  Filled 2013-11-02: qty 40

## 2013-11-02 MED ORDER — CAPTOPRIL 25 MG PO TABS: 25.0000 mg | ORAL_TABLET | Freq: Three times a day (TID) | ORAL | Status: DC

## 2013-11-02 MED ORDER — ONDANSETRON HCL 4 MG/2ML IJ SOLN
4.0000 mg | Freq: Once | INTRAMUSCULAR | Status: AC
  Administered 2013-11-02: 4 mg via INTRAVENOUS
  Filled 2013-11-02: qty 2

## 2013-11-02 MED ORDER — SUCRALFATE 1 G PO TABS: 1.0000 g | ORAL_TABLET | Freq: Three times a day (TID) | ORAL | Status: DC

## 2013-11-02 MED ORDER — CLONIDINE HCL 0.1 MG PO TABS
0.2000 mg | ORAL_TABLET | Freq: Two times a day (BID) | ORAL | Status: DC
  Administered 2013-11-02: 0.2 mg via ORAL
  Filled 2013-11-02: qty 2

## 2013-11-02 MED ORDER — SODIUM CHLORIDE 0.9 % IV SOLN
1000.0000 mL | Freq: Once | INTRAVENOUS | Status: AC
  Administered 2013-11-02: 1000 mL via INTRAVENOUS

## 2013-11-02 MED ORDER — HYDROMORPHONE HCL PF 1 MG/ML IJ SOLN
1.0000 mg | INTRAMUSCULAR | Status: AC | PRN
  Administered 2013-11-02 (×3): 1 mg via INTRAVENOUS
  Filled 2013-11-02 (×3): qty 1

## 2013-11-02 MED ORDER — SODIUM CHLORIDE 0.9 % IV SOLN: 1000.0000 mL | INTRAVENOUS | Status: DC

## 2013-11-02 NOTE — ED Notes (Signed)
NS infusion rate decreased d/t IV access not optimal (difficult to access pt's veins).

## 2013-11-02 NOTE — ED Notes (Signed)
Pt c/o generalized abd pain since Wed; nausea and "loose stools that are dark and tarry." Sts. Pain is "worse than having a baby."

## 2013-11-02 NOTE — ED Notes (Signed)
Pt ambulatory to [facility]

## 2013-11-02 NOTE — ED Provider Notes (Addendum)
CSN: [id number]     Arrival date & time 11/02/13  0719 History   First MD Initiated Contact with Patient 11/02/13 [phone]     Chief Complaint  Patient presents with  . Abdominal Pain    HPI She is having stomach pain today.  It started  For 3 days.  She was not able to see her doctor. She feels like her stomach is tearing apart.  It is in the upper and lower abdomen. She has tried her protonix but it has not helped.  She has also tried ginger ale to settle her stomach.  She has not been eating as much.  She has had this trouble before and was told that she had ulcer troubles after an endoscopy.  She had an upper and lower endoscopy two weeks ago.  Part of that was for her colon ca surveilance.  She in in remission. Past Medical History  Diagnosis Date  . Cancer   . Hypertension   . Anemia   . GERD (gastroesophageal reflux disease)   . Tension headache   . Colon cancer   . Migraine    Past Surgical History  Procedure Laterality Date  . Cholecystectomy    . Abdominal hysterectomy    . Colon surgery     No family history on file. History  Substance Use Topics  . Smoking status: Never Smoker   . Smokeless tobacco: Not on file  . Alcohol Use: No   OB History   Grav Para Term Preterm Abortions TAB SAB Ect Mult Living                 Review of Systems  Constitutional: Negative for fever.  Respiratory: Negative for chest tightness and shortness of breath.   Cardiovascular: Negative for chest pain.  Gastrointestinal: Positive for nausea and abdominal pain. Negative for vomiting, diarrhea and blood in stool.  All other systems reviewed and are negative.     Allergies  Nsaids  Home Medications   Prior to Admission medications   Medication Sig Start Date End Date Taking? Authorizing Provider  AmLODIPine Besylate (NORVASC PO) Take by mouth.    Historical Provider, MD  captopril (CAPOTEN) 25 MG tablet Take 25 mg by mouth 3 (three) times daily.    Historical Provider, MD   cloNIDine (CATAPRES) 0.2 MG tablet Take 0.2 mg by mouth 2 (two) times daily.    Historical Provider, MD  esomeprazole (NEXIUM) 40 MG capsule Take 40 mg by mouth daily before breakfast.      Historical Provider, MD  ferrous sulfate 325 (65 FE) MG tablet Take 325 mg by mouth 3 (three) times daily.     Historical Provider, MD  furosemide (LASIX) 20 MG tablet Take 20 mg by mouth.    Historical Provider, MD  HYDROcodone-acetaminophen (NORCO/VICODIN) 5-325 MG per tablet Take 2 tablets by mouth every 4 (four) hours as needed. 07/15/13   Fransico Meadow, PA-C  metoprolol (LOPRESSOR) 50 MG tablet Take 50 mg by mouth 2 (two) times daily.    Historical Provider, MD  promethazine (PHENERGAN) 25 MG tablet Take 1 tablet (25 mg total) by mouth every 6 (six) hours as needed for nausea. 08/19/12   Kaitlyn Szekalski, PA-C  promethazine (PHENERGAN) 25 MG tablet Take 1 tablet (25 mg total) by mouth every 6 (six) hours as needed for nausea or vomiting. 07/15/13   Fransico Meadow, PA-C  sucralfate (CARAFATE) 1 G tablet Take 1 tablet (1 g total) by mouth 4 (four) times  daily -  with meals and at bedtime. 11/02/13   Dorie Rank, MD   BP 144/75  Pulse 87  Temp(Src) 97.6 F (36.4 C) (Oral)  Resp 20  Ht 5\' 3"  (1.6 m)  Wt 218 lb (98.884 kg)  BMI 38.63 kg/m2  SpO2 99% Physical Exam  Nursing note and vitals reviewed. Constitutional: She appears well-developed and well-nourished. No distress.  Obese   HENT:  Head: Normocephalic and atraumatic.  Right Ear: External ear normal.  Left Ear: External ear normal.  Eyes: Conjunctivae are normal. Right eye exhibits no discharge. Left eye exhibits no discharge. No scleral icterus.  Neck: Neck supple. No tracheal deviation present.  Cardiovascular: Normal rate, regular rhythm and intact distal pulses.   Pulmonary/Chest: Effort normal and breath sounds normal. No stridor. No respiratory distress. She has no wheezes. She has no rales.  Abdominal: Soft. Bowel sounds are normal. She  exhibits no distension and no pulsatile midline mass. There is no tenderness. There is no rebound and no guarding.  Musculoskeletal: She exhibits no edema and no tenderness.  Neurological: She is alert. She has normal strength. No cranial nerve deficit (no facial droop, extraocular movements intact, no slurred speech) or sensory deficit. She exhibits normal muscle tone. She displays no seizure activity. Coordination normal.  Skin: Skin is warm and dry. No rash noted.  Psychiatric: She has a normal mood and affect.    ED Course  Procedures (including critical care time) Labs Review Labs Reviewed  CBC WITH DIFFERENTIAL - Abnormal; Notable for the following:    RDW 15.6 (*)    Monocytes Relative 13 (*)    All other components within normal limits  COMPREHENSIVE METABOLIC PANEL - Abnormal; Notable for the following:    Glucose, Bld 100 (*)    BUN 25 (*)    Alkaline Phosphatase 150 (*)    Total Bilirubin <0.2 (*)    GFR calc non Af Amer 69 (*)    GFR calc Af Amer 80 (*)    All other components within normal limits  LIPASE, BLOOD  URINALYSIS, ROUTINE W REFLEX MICROSCOPIC  OCCULT BLOOD X 1 CARD TO LAB, STOOL  POC OCCULT BLOOD, ED    Imaging Review Dg Abd Acute W/chest  11/02/2013   CLINICAL DATA:  Pain.  EXAM: ACUTE ABDOMEN SERIES (ABDOMEN 2 VIEW & CHEST 1 VIEW)  COMPARISON:  [facility] 09/16/2013.  CT 03/16/2011, 01/19/2011.  FINDINGS: Soft tissue structures are unremarkable. The gas pattern is nonspecific. No bowel distention. Surgical clips and staples noted over abdomen. Stable calcifications noted over flank consistent with phleboliths. No acute bony abnormality identified few degenerative changes lumbar spine and both hips. Scoliosis lumbar spine concave left. Calcified injection granulomas.  Chest x-ray reveals mild bibasilar atelectasis. Cardiomegaly is present. Stable elevation left hemidiaphragm.  IMPRESSION: 1.  Nonspecific abdomen.  No bowel distention.  2.  Mild bibasilar atelectasis.   Mild cardiomegaly.   Electronically Signed   By: Marcello Moores  Register   On: 11/02/2013 08:40     EKG Interpretation None      MDM   Final diagnoses:  Abdominal pain, unspecified abdominal location    Patient has known history of gastritis/ulcer based on a recent endoscopy according to the patient. She does not have any focal tenderness on abdominal exam. I doubt colitis, obstruction.  No mass appreciated on exam. I doubt aortic aneurysm or other emergent vascular issue.  The patient has noticed some dark stools however her hemoglobin is normal and she is guaiac negative.  I doubt significant gastrointestinal bleeding.  Hypertonic and Carafate to her proton pump inhibitor. Monitor for fever worsening symptoms. Followup with her doctor this coming week.    Dorie Rank, MD 11/02/13 1047  Pt did not take her blood pressure medications today.  Will give dose of her catapres and captopril.  Dorie Rank, MD 11/02/13 1116

## 2013-11-02 NOTE — ED Notes (Signed)
Pt did not receive amt of NS ordered d/t IV pump continually alarming for "Check Line"; IV access very positional. MD aware.

## 2013-11-02 NOTE — ED Notes (Signed)
MD aware of BP and is ok to proceed with discharge.

## 2013-11-02 NOTE — Discharge Instructions (Signed)

## 2014-01-31 ENCOUNTER — Emergency Department (HOSPITAL_BASED_OUTPATIENT_CLINIC_OR_DEPARTMENT_OTHER)
Admission: EM | Admit: 2014-01-31 | Discharge: 2014-01-31 | Disposition: A | Discharge status: Home / Self Care | Payer: Medicaid Other | Attending: Emergency Medicine | Admitting: Emergency Medicine

## 2014-01-31 ENCOUNTER — Encounter (HOSPITAL_BASED_OUTPATIENT_CLINIC_OR_DEPARTMENT_OTHER): Payer: Self-pay | Admitting: General Practice

## 2014-01-31 DIAGNOSIS — R51 Headache: Secondary | ICD-10-CM

## 2014-01-31 DIAGNOSIS — I10 Essential (primary) hypertension: Secondary | ICD-10-CM | POA: Insufficient documentation

## 2014-01-31 DIAGNOSIS — R109 Unspecified abdominal pain: Secondary | ICD-10-CM | POA: Insufficient documentation

## 2014-01-31 DIAGNOSIS — K219 Gastro-esophageal reflux disease without esophagitis: Secondary | ICD-10-CM | POA: Insufficient documentation

## 2014-01-31 DIAGNOSIS — G43909 Migraine, unspecified, not intractable, without status migrainosus: Secondary | ICD-10-CM | POA: Diagnosis present

## 2014-01-31 DIAGNOSIS — Z79899 Other long term (current) drug therapy: Secondary | ICD-10-CM | POA: Insufficient documentation

## 2014-01-31 DIAGNOSIS — G8929 Other chronic pain: Secondary | ICD-10-CM

## 2014-01-31 DIAGNOSIS — Z9089 Acquired absence of other organs: Secondary | ICD-10-CM | POA: Diagnosis not present

## 2014-01-31 DIAGNOSIS — Z85038 Personal history of other malignant neoplasm of large intestine: Secondary | ICD-10-CM | POA: Insufficient documentation

## 2014-01-31 DIAGNOSIS — Z90710 Acquired absence of both cervix and uterus: Secondary | ICD-10-CM | POA: Insufficient documentation

## 2014-01-31 DIAGNOSIS — D649 Anemia, unspecified: Secondary | ICD-10-CM | POA: Diagnosis not present

## 2014-01-31 MED ORDER — HYDROMORPHONE HCL 1 MG/ML IJ SOLN
0.5000 mg | Freq: Once | INTRAMUSCULAR | Status: AC
  Administered 2014-01-31: 0.5 mg via INTRAMUSCULAR
  Filled 2014-01-31: qty 1

## 2014-01-31 MED ORDER — PROMETHAZINE HCL 25 MG/ML IJ SOLN
50.0000 mg | Freq: Once | INTRAMUSCULAR | Status: AC
  Administered 2014-01-31: 50 mg via INTRAMUSCULAR
  Filled 2014-01-31: qty 2

## 2014-01-31 NOTE — ED Provider Notes (Signed)
CSN: [id number]     Arrival date & time 01/31/14  0736 History   First MD Initiated Contact with Patient 01/31/14 [phone]     Chief Complaint  Patient presents with  . Migraine  . Abdominal Pain   Victoria Sullivan is a 58 y.o. female with a history of frequent visits to multiple ER's for chronic headaches presenting with pain that is typical of her usual headaches.   She describes a "pounding" 10/10 constant headache that is bilateral covering the forehead with radiation around the sides of her head to the back of her head gradually starting yesterday evening. Has had nausea with 1 episode NBNB emesis. Normal bowel function. Denies dizziness, syncope, visual or hearing changes. Reports some mild photophobia without sensitivity to loud sounds. Took fioricet yesterday with no improvement in pain.   When informed that she will be receiving medications for rescue pain relief she asks what she will receive and says that 2mg  of dilaudid and phenergan has worked in the past.   (Consider location/radiation/quality/duration/timing/severity/associated sxs/prior Treatment) HPI  Past Medical History  Diagnosis Date  . Cancer   . Hypertension   . Anemia   . GERD (gastroesophageal reflux disease)   . Tension headache   . Colon cancer   . Migraine    Past Surgical History  Procedure Laterality Date  . Cholecystectomy    . Abdominal hysterectomy    . Colon surgery     History reviewed. No pertinent family history. History  Substance Use Topics  . Smoking status: Never Smoker   . Smokeless tobacco: Not on file  . Alcohol Use: No   OB History    No data available     Review of Systems  Constitutional: Negative for fever, chills and appetite change.  HENT: Negative for congestion, ear pain, sneezing, sore throat and tinnitus.   Eyes: Positive for photophobia. Negative for pain, redness and visual disturbance.  Respiratory: Negative for cough, chest tightness and shortness of breath.    Cardiovascular: Negative for chest pain and palpitations.  Gastrointestinal: Positive for nausea, vomiting and abdominal pain (also a chronic problem for her, unchanged.). Negative for diarrhea, constipation and blood in stool.  Genitourinary: Negative for dysuria, frequency and decreased urine volume.  Musculoskeletal: Negative for neck pain and neck stiffness.  Neurological: Positive for headaches. Negative for dizziness, syncope, speech difficulty and numbness.   Allergies  Nsaids  Home Medications   Prior to Admission medications   Medication Sig Start Date End Date Taking? Authorizing Provider  pantoprazole (PROTONIX) 20 MG tablet Take 20 mg by mouth daily.   Yes Historical Provider, MD  AmLODIPine Besylate (NORVASC PO) Take by mouth.    Historical Provider, MD  captopril (CAPOTEN) 25 MG tablet Take 25 mg by mouth 3 (three) times daily.    Historical Provider, MD  cloNIDine (CATAPRES) 0.2 MG tablet Take 0.2 mg by mouth 2 (two) times daily.    Historical Provider, MD  esomeprazole (NEXIUM) 40 MG capsule Take 40 mg by mouth daily before breakfast.      Historical Provider, MD  ferrous sulfate 325 (65 FE) MG tablet Take 325 mg by mouth 3 (three) times daily.     Historical Provider, MD  furosemide (LASIX) 20 MG tablet Take 20 mg by mouth.    Historical Provider, MD  HYDROcodone-acetaminophen (NORCO/VICODIN) 5-325 MG per tablet Take 2 tablets by mouth every 4 (four) hours as needed. 07/15/13   Fransico Meadow, PA-C  metoprolol (LOPRESSOR) 50 MG tablet Take  50 mg by mouth 2 (two) times daily.    Historical Provider, MD  promethazine (PHENERGAN) 25 MG tablet Take 1 tablet (25 mg total) by mouth every 6 (six) hours as needed for nausea. 08/19/12   Kaitlyn Szekalski, PA-C  promethazine (PHENERGAN) 25 MG tablet Take 1 tablet (25 mg total) by mouth every 6 (six) hours as needed for nausea or vomiting. 07/15/13   Fransico Meadow, PA-C  sucralfate (CARAFATE) 1 G tablet Take 1 tablet (1 g total) by  mouth 4 (four) times daily -  with meals and at bedtime. 11/02/13   Dorie Rank, MD   BP 159/95 mmHg  Pulse 96  Temp(Src) 97.7 F (36.5 C) (Oral)  Resp 20  Ht 5\' 3"  (1.6 m)  Wt 226 lb (102.513 kg)  BMI 40.04 kg/m2  SpO2 99% Physical Exam  Constitutional: She is oriented to person, place, and time. She appears well-developed and well-nourished. No distress.  HENT:  Head: Normocephalic and atraumatic.  Right Ear: External ear normal.  Left Ear: External ear normal.  Nose: Nose normal.  Mouth/Throat: Oropharynx is clear and moist.  no pain to percussion of maxillary or frontal sinuses.  Eyes: Conjunctivae and EOM are normal. Pupils are equal, round, and reactive to light. No scleral icterus.  Neck: Neck supple.  Cardiovascular: Normal rate, regular rhythm, normal heart sounds and intact distal pulses.   No murmur heard. Pulmonary/Chest: Effort normal and breath sounds normal. No respiratory distress.  Abdominal: Soft. Bowel sounds are normal. She exhibits no distension. There is no tenderness.  Musculoskeletal: Normal range of motion. She exhibits no edema or tenderness.  Lymphadenopathy:    She has no cervical adenopathy.  Neurological: She is alert and oriented to person, place, and time. No cranial nerve deficit. She exhibits normal muscle tone. Coordination normal.  Skin: Skin is warm and dry. No rash noted.  Psychiatric:  speech is depressed in tone and cadence. Struggles with blocking sometimes.  Vitals reviewed.   ED Course  Procedures (including critical care time) Labs Review Labs Reviewed - No data to display  Imaging Review No results found.   EKG Interpretation None      MDM   Final diagnoses:  Chronic nonintractable headache, unspecified headache type    58 yo female with frequent ER visits for chronic headaches without typical migraine features. No concern for [facility] or other life-threatening etiology. - Dilaudid 0.5mg  IM and phenergan 50mg  IM x1.  NSAIDs/excedrin cannot be given due to gastric ulcer.  - Follow up with PCP, possible referral to headache specialist.   HTN: No signs or symptoms of accelerated HTN. Has not taken BP medications yet this AM.  - Will advise her to take these BP medications.      Patrecia Pour, MD 01/31/14 0160  Dot Lanes, MD 01/31/14 [phone]

## 2014-01-31 NOTE — ED Notes (Signed)
Pt c/o of migraine h/a and abdominal pain. Pt recently had an endoscopy procedure and dx with an ulcer. Pt states she has been taking medication for it. Pt states she woke up yesterday morning with headache/neck pain, which is not new for her. Stomach pain for last few months but today it is worse. N/V yesterday. No diarrhea.

## 2014-01-31 NOTE — Discharge Instructions (Signed)
You were treated for a headache today. It is best that you see your primary care doctor or a neurology headache specialist to discuss options to prevent these headaches you've been having. Seek medical attention if you experience trouble speaking, trouble walking, or numbness or tingling or weakness on one side of your body.   Headaches, Frequently Asked Questions MIGRAINE HEADACHES Q: What is migraine? What causes it? How can I treat it? A: Generally, migraine headaches begin as a dull ache. Then they develop into a constant, throbbing, and pulsating pain. You may experience pain at the temples. You may experience pain at the front or back of one or both sides of the head. The pain is usually accompanied by a combination of:  Nausea.  Vomiting.  Sensitivity to light and noise. Some people (about 15%) experience an aura (see below) before an attack. The cause of migraine is believed to be chemical reactions in the brain. Treatment for migraine may include over-the-counter or prescription medications. It may also include self-help techniques. These include relaxation training and biofeedback.  Q: What is an aura? A: About 15% of people with migraine get an "aura". This is a sign of neurological symptoms that occur before a migraine headache. You may see wavy or jagged lines, dots, or flashing lights. You might experience tunnel vision or blind spots in one or both eyes. The aura can include visual or auditory hallucinations (something imagined). It may include disruptions in smell (such as strange odors), taste or touch. Other symptoms include:  Numbness.  A "pins and needles" sensation.  Difficulty in recalling or speaking the correct word. These neurological events may last as long as 60 minutes. These symptoms will fade as the headache begins. Q: What is a trigger? A: Certain physical or environmental factors can lead to or "trigger" a migraine. These include:  Foods.  Hormonal  changes.  Weather.  Stress. It is important to remember that triggers are different for everyone. To help prevent migraine attacks, you need to figure out which triggers affect you. Keep a headache diary. This is a good way to track triggers. The diary will help you talk to your healthcare professional about your condition. Q: Does weather affect migraines? A: Bright sunshine, hot, humid conditions, and drastic changes in barometric pressure may lead to, or "trigger," a migraine attack in some people. But studies have shown that weather does not act as a trigger for everyone with migraines. Q: What is the link between migraine and hormones? A: Hormones start and regulate many of your body's functions. Hormones keep your body in balance within a constantly changing environment. The levels of hormones in your body are unbalanced at times. Examples are during menstruation, pregnancy, or menopause. That can lead to a migraine attack. In fact, about three quarters of all women with migraine report that their attacks are related to the menstrual cycle.  Q: Is there an increased risk of stroke for migraine sufferers? A: The likelihood of a migraine attack causing a stroke is very remote. That is not to say that migraine sufferers cannot have a stroke associated with their migraines. In persons under age 65, the most common associated factor for stroke is migraine headache. But over the course of a person's normal life span, the occurrence of migraine headache may actually be associated with a reduced risk of dying from cerebrovascular disease due to stroke.  Q: What are acute medications for migraine? A: Acute medications are used to treat the pain of  the headache after it has started. Examples over-the-counter medications, NSAIDs, ergots, and triptans.  Q: What are the triptans? A: Triptans are the newest class of abortive medications. They are specifically targeted to treat migraine. Triptans are  vasoconstrictors. They moderate some chemical reactions in the brain. The triptans work on receptors in your brain. Triptans help to restore the balance of a neurotransmitter called serotonin. Fluctuations in levels of serotonin are thought to be a main cause of migraine.  Q: Are over-the-counter medications for migraine effective? A: Over-the-counter, or "OTC," medications may be effective in relieving mild to moderate pain and associated symptoms of migraine. But you should see your caregiver before beginning any treatment regimen for migraine.  Q: What are preventive medications for migraine? A: Preventive medications for migraine are sometimes referred to as "prophylactic" treatments. They are used to reduce the frequency, severity, and length of migraine attacks. Examples of preventive medications include antiepileptic medications, antidepressants, beta-blockers, calcium channel blockers, and NSAIDs (nonsteroidal anti-inflammatory drugs). Q: Why are anticonvulsants used to treat migraine? A: During the past few years, there has been an increased interest in antiepileptic drugs for the prevention of migraine. They are sometimes referred to as "anticonvulsants". Both epilepsy and migraine may be caused by similar reactions in the brain.  Q: Why are antidepressants used to treat migraine? A: Antidepressants are typically used to treat people with depression. They may reduce migraine frequency by regulating chemical levels, such as serotonin, in the brain.  Q: What alternative therapies are used to treat migraine? A: The term "alternative therapies" is often used to describe treatments considered outside the scope of conventional Western medicine. Examples of alternative therapy include acupuncture, acupressure, and yoga. Another common alternative treatment is herbal therapy. Some herbs are believed to relieve headache pain. Always discuss alternative therapies with your caregiver before proceeding. Some  herbal products contain arsenic and other toxins. TENSION HEADACHES Q: What is a tension-type headache? What causes it? How can I treat it? A: Tension-type headaches occur randomly. They are often the result of temporary stress, anxiety, fatigue, or anger. Symptoms include soreness in your temples, a tightening band-like sensation around your head (a "vice-like" ache). Symptoms can also include a pulling feeling, pressure sensations, and contracting head and neck muscles. The headache begins in your forehead, temples, or the back of your head and neck. Treatment for tension-type headache may include over-the-counter or prescription medications. Treatment may also include self-help techniques such as relaxation training and biofeedback. CLUSTER HEADACHES Q: What is a cluster headache? What causes it? How can I treat it? A: Cluster headache gets its name because the attacks come in groups. The pain arrives with little, if any, warning. It is usually on one side of the head. A tearing or bloodshot eye and a runny nose on the same side of the headache may also accompany the pain. Cluster headaches are believed to be caused by chemical reactions in the brain. They have been described as the most severe and intense of any headache type. Treatment for cluster headache includes prescription medication and oxygen. SINUS HEADACHES Q: What is a sinus headache? What causes it? How can I treat it? A: When a cavity in the bones of the face and skull (a sinus) becomes inflamed, the inflammation will cause localized pain. This condition is usually the result of an allergic reaction, a tumor, or an infection. If your headache is caused by a sinus blockage, such as an infection, you will probably have a fever. An x-ray  will confirm a sinus blockage. Your caregiver's treatment might include antibiotics for the infection, as well as antihistamines or decongestants.  REBOUND HEADACHES Q: What is a rebound headache? What  causes it? How can I treat it? A: A pattern of taking acute headache medications too often can lead to a condition known as "rebound headache." A pattern of taking too much headache medication includes taking it more than 2 days per week or in excessive amounts. That means more than the label or a caregiver advises. With rebound headaches, your medications not only stop relieving pain, they actually begin to cause headaches. Doctors treat rebound headache by tapering the medication that is being overused. Sometimes your caregiver will gradually substitute a different type of treatment or medication. Stopping may be a challenge. Regularly overusing a medication increases the potential for serious side effects. Consult a caregiver if you regularly use headache medications more than 2 days per week or more than the label advises. ADDITIONAL QUESTIONS AND ANSWERS Q: What is biofeedback? A: Biofeedback is a self-help treatment. Biofeedback uses special equipment to monitor your body's involuntary physical responses. Biofeedback monitors:  Breathing.  Pulse.  Heart rate.  Temperature.  Muscle tension.  Brain activity. Biofeedback helps you refine and perfect your relaxation exercises. You learn to control the physical responses that are related to stress. Once the technique has been mastered, you do not need the equipment any more. Q: Are headaches hereditary? A: Four out of five (80%) of people that suffer report a family history of migraine. Scientists are not sure if this is genetic or a family predisposition. Despite the uncertainty, a child has a 50% chance of having migraine if one parent suffers. The child has a 75% chance if both parents suffer.  Q: Can children get headaches? A: By the time they reach high school, most young people have experienced some type of headache. Many safe and effective approaches or medications can prevent a headache from occurring or stop it after it has begun.  Q:  What type of doctor should I see to diagnose and treat my headache? A: Start with your primary caregiver. Discuss his or her experience and approach to headaches. Discuss methods of classification, diagnosis, and treatment. Your caregiver may decide to recommend you to a headache specialist, depending upon your symptoms or other physical conditions. Having diabetes, allergies, etc., may require a more comprehensive and inclusive approach to your headache. The National Head[employer] Foundation will provide, upon request, a list of [facility] physician members in your state. Document Released: 05/14/2003 Document Revised: 05/16/2011 Document Reviewed: 10/22/2007 [facility] Patient Information 2015 [place], [place]. This information is not intended to replace advice given to you by your health care provider. Make sure you discuss any questions you have with your health care provider.

## 2014-06-07 ENCOUNTER — Emergency Department (HOSPITAL_BASED_OUTPATIENT_CLINIC_OR_DEPARTMENT_OTHER): Payer: Medicaid Other

## 2014-06-07 ENCOUNTER — Emergency Department (HOSPITAL_BASED_OUTPATIENT_CLINIC_OR_DEPARTMENT_OTHER)
Admission: EM | Admit: 2014-06-07 | Discharge: 2014-06-07 | Disposition: A | Discharge status: Home / Self Care | Payer: Medicaid Other | Attending: Emergency Medicine | Admitting: Emergency Medicine

## 2014-06-07 ENCOUNTER — Encounter (HOSPITAL_BASED_OUTPATIENT_CLINIC_OR_DEPARTMENT_OTHER): Payer: Self-pay

## 2014-06-07 DIAGNOSIS — Z79899 Other long term (current) drug therapy: Secondary | ICD-10-CM | POA: Diagnosis not present

## 2014-06-07 DIAGNOSIS — I10 Essential (primary) hypertension: Secondary | ICD-10-CM | POA: Diagnosis not present

## 2014-06-07 DIAGNOSIS — R519 Headache, unspecified: Secondary | ICD-10-CM

## 2014-06-07 DIAGNOSIS — D649 Anemia, unspecified: Secondary | ICD-10-CM | POA: Diagnosis not present

## 2014-06-07 DIAGNOSIS — Z85038 Personal history of other malignant neoplasm of large intestine: Secondary | ICD-10-CM | POA: Diagnosis not present

## 2014-06-07 DIAGNOSIS — K219 Gastro-esophageal reflux disease without esophagitis: Secondary | ICD-10-CM | POA: Insufficient documentation

## 2014-06-07 DIAGNOSIS — G43909 Migraine, unspecified, not intractable, without status migrainosus: Secondary | ICD-10-CM | POA: Diagnosis not present

## 2014-06-07 DIAGNOSIS — R51 Headache: Secondary | ICD-10-CM | POA: Diagnosis present

## 2014-06-07 LAB — BASIC METABOLIC PANEL
Anion gap: 8 (ref 5–15)
BUN: 14 mg/dL (ref 6–23)
CALCIUM: 8.8 mg/dL (ref 8.4–10.5)
CHLORIDE: 102 mmol/L (ref 96–112)
CO2: 28 mmol/L (ref 19–32)
Creatinine, Ser: 0.71 mg/dL (ref 0.50–1.10)
GFR calc Af Amer: >90 mL/min (ref >90)
GFR calc non Af Amer: >90 mL/min (ref >90)
GLUCOSE: 93 mg/dL (ref 70–99)
Potassium: 4.2 mmol/L (ref 3.5–5.1)
Sodium: 138 mmol/L (ref 135–145)

## 2014-06-07 LAB — CBC
HEMATOCRIT: 45.1 % (ref 36.0–46.0)
Hemoglobin: 14.7 g/dL (ref 12.0–15.0)
MCH: 26.9 pg (ref 26.0–34.0)
MCHC: 32.6 g/dL (ref 30.0–36.0)
MCV: 82.4 fL (ref 78.0–100.0)
Platelets: 230 10*3/uL (ref 150–400)
RBC: 5.47 MIL/uL — AB (ref 3.87–5.11)
RDW: 14.7 % (ref 11.5–15.5)
WBC: 5.5 10*3/uL (ref 4.0–10.5)

## 2014-06-07 LAB — TROPONIN I: Troponin I: <0.03 ng/mL (ref <0.031)

## 2014-06-07 MED ORDER — HYDROMORPHONE HCL 1 MG/ML IJ SOLN: 2.0000 mg | Freq: Once | INTRAMUSCULAR | Status: DC

## 2014-06-07 MED ORDER — HYDROCODONE-ACETAMINOPHEN 5-325 MG PO TABS: 2.0000 | ORAL_TABLET | Freq: Once | ORAL | Status: DC

## 2014-06-07 MED ORDER — HYDROCODONE-ACETAMINOPHEN 5-325 MG PO TABS
2.0000 | ORAL_TABLET | Freq: Once | ORAL | Status: AC
  Administered 2014-06-07: 2 via ORAL
  Filled 2014-06-07: qty 2

## 2014-06-07 MED ORDER — PROMETHAZINE HCL 25 MG/ML IJ SOLN
25.0000 mg | Freq: Once | INTRAMUSCULAR | Status: AC
  Administered 2014-06-07: 25 mg via INTRAMUSCULAR
  Filled 2014-06-07: qty 1

## 2014-06-07 MED ORDER — PROMETHAZINE HCL 25 MG/ML IJ SOLN
12.5000 mg | Freq: Once | INTRAMUSCULAR | Status: AC
  Administered 2014-06-07: 12.5 mg via INTRAVENOUS
  Filled 2014-06-07: qty 1

## 2014-06-07 MED ORDER — PROMETHAZINE HCL 25 MG/ML IJ SOLN: 25.0000 mg | Freq: Once | INTRAMUSCULAR | Status: DC

## 2014-06-07 MED ORDER — SODIUM CHLORIDE 0.9 % IV SOLN
Freq: Once | INTRAVENOUS | Status: AC
  Administered 2014-06-07: 14:00:00 via INTRAVENOUS

## 2014-06-07 MED ORDER — DIPHENHYDRAMINE HCL 50 MG/ML IJ SOLN: 25.0000 mg | Freq: Once | INTRAMUSCULAR | Status: DC

## 2014-06-07 MED ORDER — HYDROMORPHONE HCL 1 MG/ML IJ SOLN
2.0000 mg | Freq: Once | INTRAMUSCULAR | Status: AC
  Administered 2014-06-07: 2 mg via INTRAMUSCULAR
  Filled 2014-06-07: qty 2

## 2014-06-07 MED ORDER — HYDROMORPHONE HCL 1 MG/ML IJ SOLN
2.0000 mg | Freq: Once | INTRAMUSCULAR | Status: AC
  Administered 2014-06-07: 2 mg via INTRAVENOUS
  Filled 2014-06-07: qty 2

## 2014-06-07 NOTE — ED Provider Notes (Signed)
CSN: [id number]     Arrival date & time 06/07/14  1021 History   First MD Initiated Contact with Patient 06/07/14 1044     Chief Complaint  Patient presents with  . Headache     (Consider location/radiation/quality/duration/timing/severity/associated sxs/prior Treatment) Patient is a 59 y.o. female presenting with headaches. The history is provided by the patient.  Headache Pain location:  Frontal Quality:  Dull Radiates to:  Does not radiate Onset quality:  Gradual Duration:  2 weeks Timing:  Constant Progression:  Worsening Chronicity:  Recurrent Similar to prior headaches: yes   Context: emotional stress (lost her spouse 3 weeks ago to lung cancer)   Relieved by:  Nothing Worsened by:  Light Associated symptoms: no abdominal pain, no cough, no eye pain, no fever and no vomiting     Past Medical History  Diagnosis Date  . Cancer   . Hypertension   . Anemia   . GERD (gastroesophageal reflux disease)   . Tension headache   . Colon cancer   . Migraine    Past Surgical History  Procedure Laterality Date  . Cholecystectomy    . Abdominal hysterectomy    . Colon surgery     No family history on file. History  Substance Use Topics  . Smoking status: Never Smoker   . Smokeless tobacco: Not on file  . Alcohol Use: No   OB History    No data available     Review of Systems  Constitutional: Negative for fever.  Eyes: Negative for pain.  Respiratory: Negative for cough and shortness of breath.   Gastrointestinal: Negative for vomiting and abdominal pain.  Neurological: Positive for headaches.  All other systems reviewed and are negative.     Allergies  Nsaids  Home Medications   Prior to Admission medications   Medication Sig Start Date End Date Taking? Authorizing Provider  AmLODIPine Besylate (NORVASC PO) Take by mouth.    Historical Provider, MD  captopril (CAPOTEN) 25 MG tablet Take 25 mg by mouth 3 (three) times daily.    Historical Provider, MD   cloNIDine (CATAPRES) 0.2 MG tablet Take 0.2 mg by mouth 2 (two) times daily.    Historical Provider, MD  esomeprazole (NEXIUM) 40 MG capsule Take 40 mg by mouth daily before breakfast.      Historical Provider, MD  ferrous sulfate 325 (65 FE) MG tablet Take 325 mg by mouth 3 (three) times daily.     Historical Provider, MD  furosemide (LASIX) 20 MG tablet Take 20 mg by mouth.    Historical Provider, MD  metoprolol (LOPRESSOR) 50 MG tablet Take 50 mg by mouth 2 (two) times daily.    Historical Provider, MD  pantoprazole (PROTONIX) 20 MG tablet Take 20 mg by mouth daily.    Historical Provider, MD  promethazine (PHENERGAN) 25 MG tablet Take 1 tablet (25 mg total) by mouth every 6 (six) hours as needed for nausea or vomiting. 07/15/13   Fransico Meadow, PA-C  sucralfate (CARAFATE) 1 G tablet Take 1 tablet (1 g total) by mouth 4 (four) times daily -  with meals and at bedtime. 11/02/13   Dorie Rank, MD   BP 199/118 mmHg  Pulse 112  Temp(Src) 98.2 F (36.8 C) (Oral)  Resp 24  Ht 5\' 3"  (1.6 m)  Wt 215 lb (97.523 kg)  BMI 38.09 kg/m2  SpO2 100% Physical Exam  Constitutional: She is oriented to person, place, and time. She appears well-developed and well-nourished. No distress.  HENT:  Head: Normocephalic and atraumatic.  Mouth/Throat: Oropharynx is clear and moist.  Eyes: EOM are normal. Pupils are equal, round, and reactive to light.  Neck: Normal range of motion. Neck supple.  Cardiovascular: Normal rate and regular rhythm.  Exam reveals no friction rub.   No murmur heard. Pulmonary/Chest: Effort normal and breath sounds normal. No respiratory distress. She has no wheezes. She has no rales.  Abdominal: Soft. She exhibits no distension. There is no tenderness. There is no rebound.  Musculoskeletal: Normal range of motion. She exhibits no edema.  Neurological: She is alert and oriented to person, place, and time.  Skin: She is not diaphoretic.  Nursing note and vitals reviewed.   ED Course   Procedures (including critical care time) Labs Review Labs Reviewed  CBC  BASIC METABOLIC PANEL  TROPONIN I    Imaging Review Ct Head Wo Contrast  06/07/2014   CLINICAL DATA:  Acute headache for 3 weeks.  History of migraines.  EXAM: CT HEAD WITHOUT CONTRAST  TECHNIQUE: Contiguous axial images were obtained from the base of the skull through the vertex without contrast.  COMPARISON:  06/16/2013  FINDINGS: Normal appearance of the intracranial structures. No evidence for acute hemorrhage, mass lesion, midline shift, hydrocephalus or large infarct. No acute bony abnormality. The visualized sinuses are clear.  IMPRESSION: No acute intracranial abnormality.   Electronically Signed   By: Jerilynn Mages.  Shick M.D.   On: 06/07/2014 11:51     EKG Interpretation None     Angiocath insertion Performed by: Osvaldo Shipper  Consent: Verbal consent obtained. Risks and benefits: risks, benefits and alternatives were discussed Time out: Immediately prior to procedure a "time out" was called to verify the correct patient, procedure, equipment, support staff and site/side marked as required.  Preparation: Patient was prepped and draped in the usual sterile fashion.  Vein Location: R AC  Yes Ultrasound Guided  Gauge: 20  Normal blood return and flush without difficulty Patient tolerance: Patient tolerated the procedure well with no immediate complications.    MDM   Final diagnoses:  Headache    55F here with headache. 2 weeks of headache. Lost her spouse 3 weeks ago. Similar to prior headaches, but can't shake it. Normally headaches are controlled with dilaudid and phenergan.  No fevers, no blurry vision. No numbness, tingling, weakness.  No nausea or vomiting. She has been taking all of her anti-HTN meds. Here markedly hypertensive. Neurologically intact. Will check labs, check Head CT, give meds for headache. Suspect this could be due to stress over losing her spouse.  I saw her a year  ago for headache and similar BPs. She was admitted to [facility] at that time for hypertensive urgency.  Here BP improving after headache control. She would rather go home today. With normal labs and CT, improving BP, I feel this is reasonable. Patient stable for discharge. I do not feel she has symptoms of hypertensive urgency today.  Evelina Bucy, MD 06/07/14 1525

## 2014-06-07 NOTE — ED Notes (Addendum)
Patient here with daily intermittent frontal headache x 2 weeks. Reports that this all started after the death of her spouse 3 weeks ago. Patient tearful on assessment, taking xanax, since death of her spouse.

## 2014-06-07 NOTE — ED Notes (Signed)
Patient transported to CT 

## 2014-06-07 NOTE — Discharge Instructions (Signed)

## 2014-06-07 NOTE — ED Notes (Signed)
Pt states Percocet taken 2 tabs, 2 days ago, no relief noted, pt states also sensitive to light

## 2014-06-07 NOTE — ED Notes (Signed)
IV attempted x2- unsuccessful. 

## 2014-07-03 ENCOUNTER — Encounter (HOSPITAL_BASED_OUTPATIENT_CLINIC_OR_DEPARTMENT_OTHER): Payer: Self-pay | Admitting: *Deleted

## 2014-07-03 ENCOUNTER — Emergency Department (HOSPITAL_BASED_OUTPATIENT_CLINIC_OR_DEPARTMENT_OTHER)
Admission: EM | Admit: 2014-07-03 | Discharge: 2014-07-03 | Disposition: A | Discharge status: Home / Self Care | Payer: Medicaid Other | Attending: Emergency Medicine | Admitting: Emergency Medicine

## 2014-07-03 DIAGNOSIS — G43909 Migraine, unspecified, not intractable, without status migrainosus: Secondary | ICD-10-CM | POA: Insufficient documentation

## 2014-07-03 DIAGNOSIS — R51 Headache: Secondary | ICD-10-CM | POA: Diagnosis present

## 2014-07-03 DIAGNOSIS — K219 Gastro-esophageal reflux disease without esophagitis: Secondary | ICD-10-CM | POA: Diagnosis not present

## 2014-07-03 DIAGNOSIS — D649 Anemia, unspecified: Secondary | ICD-10-CM | POA: Diagnosis not present

## 2014-07-03 DIAGNOSIS — Z85038 Personal history of other malignant neoplasm of large intestine: Secondary | ICD-10-CM | POA: Diagnosis not present

## 2014-07-03 DIAGNOSIS — I10 Essential (primary) hypertension: Secondary | ICD-10-CM | POA: Insufficient documentation

## 2014-07-03 DIAGNOSIS — R519 Headache, unspecified: Secondary | ICD-10-CM

## 2014-07-03 DIAGNOSIS — Z79899 Other long term (current) drug therapy: Secondary | ICD-10-CM | POA: Diagnosis not present

## 2014-07-03 MED ORDER — HYDROMORPHONE HCL 1 MG/ML IJ SOLN
2.0000 mg | Freq: Once | INTRAMUSCULAR | Status: AC
  Administered 2014-07-03: 2 mg via INTRAMUSCULAR
  Filled 2014-07-03: qty 2

## 2014-07-03 MED ORDER — PROMETHAZINE HCL 25 MG/ML IJ SOLN
25.0000 mg | Freq: Once | INTRAMUSCULAR | Status: AC
  Administered 2014-07-03: 25 mg via INTRAMUSCULAR
  Filled 2014-07-03: qty 1

## 2014-07-03 NOTE — Discharge Instructions (Signed)

## 2014-07-03 NOTE — ED Notes (Signed)
Pt reports HA intermittently since her husband died 17 month ago.  States that this one started 2-3 days ago and she is unable to 'kick it'.  Denies it being worse than usual.

## 2014-07-03 NOTE — ED Provider Notes (Signed)
CSN: [id number]     Arrival date & time 07/03/14  1342 History   First MD Initiated Contact with Patient 07/03/14 1345     Chief Complaint  Patient presents with  . Headache     (Consider location/radiation/quality/duration/timing/severity/associated sxs/prior Treatment) HPI Comments: Patient is a 59 year old female who presents with complaints of a "stress headache". She reports her husband died approximately 6 weeks ago and has been having increased headaches since that time. She was seen here several weeks ago and had a CT scan performed which was unremarkable. She denies any fevers or chills. She denies any visual disturbances. She denies any stiff neck. She tells me that Dilaudid makes her pain better.  Patient is a 59 y.o. female presenting with headaches. The history is provided by the patient.  Headache Pain location:  Frontal Quality:  Stabbing Radiates to:  Does not radiate Timing:  Constant Progression:  Worsening Chronicity:  Recurrent Similar to prior headaches: yes   Context: activity and bright light   Relieved by:  Nothing Worsened by:  Nothing Ineffective treatments:  None tried   Past Medical History  Diagnosis Date  . Cancer   . Hypertension   . Anemia   . GERD (gastroesophageal reflux disease)   . Tension headache   . Colon cancer   . Migraine    Past Surgical History  Procedure Laterality Date  . Cholecystectomy    . Abdominal hysterectomy    . Colon surgery     History reviewed. No pertinent family history. History  Substance Use Topics  . Smoking status: Never Smoker   . Smokeless tobacco: Not on file  . Alcohol Use: No   OB History    No data available     Review of Systems  Neurological: Positive for headaches.  All other systems reviewed and are negative.     Allergies  Nsaids  Home Medications   Prior to Admission medications   Medication Sig Start Date End Date Taking? Authorizing Provider  AmLODIPine Besylate (NORVASC  PO) Take by mouth.    Historical Provider, MD  captopril (CAPOTEN) 25 MG tablet Take 25 mg by mouth 3 (three) times daily.    Historical Provider, MD  cloNIDine (CATAPRES) 0.2 MG tablet Take 0.2 mg by mouth 2 (two) times daily.    Historical Provider, MD  esomeprazole (NEXIUM) 40 MG capsule Take 40 mg by mouth daily before breakfast.      Historical Provider, MD  ferrous sulfate 325 (65 FE) MG tablet Take 325 mg by mouth 3 (three) times daily.     Historical Provider, MD  furosemide (LASIX) 20 MG tablet Take 20 mg by mouth.    Historical Provider, MD  metoprolol (LOPRESSOR) 50 MG tablet Take 50 mg by mouth 2 (two) times daily.    Historical Provider, MD  pantoprazole (PROTONIX) 20 MG tablet Take 20 mg by mouth daily.    Historical Provider, MD  promethazine (PHENERGAN) 25 MG tablet Take 1 tablet (25 mg total) by mouth every 6 (six) hours as needed for nausea or vomiting. 07/15/13   Fransico Meadow, PA-C  sucralfate (CARAFATE) 1 G tablet Take 1 tablet (1 g total) by mouth 4 (four) times daily -  with meals and at bedtime. 11/02/13   Dorie Rank, MD   BP 138/94 mmHg  Pulse 91  Temp(Src) 97.9 F (36.6 C) (Oral)  Resp 22  Ht 5\' 3"  (1.6 m)  Wt 215 lb (97.523 kg)  BMI 38.09 kg/m2 Physical Exam  Constitutional: She is oriented to person, place, and time. She appears well-developed and well-nourished. No distress.  HENT:  Head: Normocephalic and atraumatic.  Eyes: EOM are normal. Pupils are equal, round, and reactive to light.  There is no papilledema on funduscopic exam.  Neck: Normal range of motion. Neck supple.  Cardiovascular: Normal rate and regular rhythm.  Exam reveals no gallop and no friction rub.   No murmur heard. Pulmonary/Chest: Effort normal and breath sounds normal. No respiratory distress. She has no wheezes.  Abdominal: Soft. Bowel sounds are normal. She exhibits no distension. There is no tenderness.  Musculoskeletal: Normal range of motion.  Neurological: She is alert and  oriented to person, place, and time. No cranial nerve deficit. She exhibits normal muscle tone. Coordination normal.  Skin: Skin is warm and dry. She is not diaphoretic.  Nursing note and vitals reviewed.   ED Course  Procedures (including critical care time) Labs Review Labs Reviewed - No data to display  Imaging Review No results found.   EKG Interpretation None      MDM   Final diagnoses:  None    Patient feeling better after medications given in the ER. She is to follow-up with her primary Dr. if not improving.    Veryl Speak, MD 07/03/14 [phone]

## 2014-09-03 ENCOUNTER — Emergency Department (HOSPITAL_BASED_OUTPATIENT_CLINIC_OR_DEPARTMENT_OTHER)
Admission: EM | Admit: 2014-09-03 | Discharge: 2014-09-03 | Disposition: A | Discharge status: Home / Self Care | Payer: Medicaid Other | Attending: Emergency Medicine | Admitting: Emergency Medicine

## 2014-09-03 ENCOUNTER — Encounter (HOSPITAL_BASED_OUTPATIENT_CLINIC_OR_DEPARTMENT_OTHER): Payer: Self-pay

## 2014-09-03 DIAGNOSIS — R112 Nausea with vomiting, unspecified: Secondary | ICD-10-CM

## 2014-09-03 DIAGNOSIS — R1084 Generalized abdominal pain: Secondary | ICD-10-CM | POA: Diagnosis not present

## 2014-09-03 DIAGNOSIS — Z85038 Personal history of other malignant neoplasm of large intestine: Secondary | ICD-10-CM | POA: Diagnosis not present

## 2014-09-03 DIAGNOSIS — I10 Essential (primary) hypertension: Secondary | ICD-10-CM | POA: Diagnosis not present

## 2014-09-03 DIAGNOSIS — K219 Gastro-esophageal reflux disease without esophagitis: Secondary | ICD-10-CM | POA: Diagnosis not present

## 2014-09-03 DIAGNOSIS — D649 Anemia, unspecified: Secondary | ICD-10-CM | POA: Insufficient documentation

## 2014-09-03 DIAGNOSIS — Z859 Personal history of malignant neoplasm, unspecified: Secondary | ICD-10-CM | POA: Insufficient documentation

## 2014-09-03 DIAGNOSIS — G43909 Migraine, unspecified, not intractable, without status migrainosus: Secondary | ICD-10-CM | POA: Diagnosis not present

## 2014-09-03 DIAGNOSIS — Z79899 Other long term (current) drug therapy: Secondary | ICD-10-CM | POA: Insufficient documentation

## 2014-09-03 DIAGNOSIS — G43009 Migraine without aura, not intractable, without status migrainosus: Secondary | ICD-10-CM

## 2014-09-03 DIAGNOSIS — R51 Headache: Secondary | ICD-10-CM | POA: Diagnosis present

## 2014-09-03 LAB — COMPREHENSIVE METABOLIC PANEL
ALT: 18 U/L (ref 14–54)
AST: 21 U/L (ref 15–41)
Albumin: 3.9 g/dL (ref 3.5–5.0)
Alkaline Phosphatase: 106 U/L (ref 38–126)
Anion gap: 7 (ref 5–15)
BUN: 18 mg/dL (ref 6–20)
CALCIUM: 8.7 mg/dL — AB (ref 8.9–10.3)
CO2: 27 mmol/L (ref 22–32)
Chloride: 110 mmol/L (ref 101–111)
Creatinine, Ser: 0.67 mg/dL (ref 0.44–1.00)
GFR calc Af Amer: >60 mL/min (ref >60)
Glucose, Bld: 100 mg/dL — ABNORMAL HIGH (ref 65–99)
Potassium: 3.2 mmol/L — ABNORMAL LOW (ref 3.5–5.1)
SODIUM: 144 mmol/L (ref 135–145)
Total Bilirubin: 0.4 mg/dL (ref 0.3–1.2)
Total Protein: 7.1 g/dL (ref 6.5–8.1)

## 2014-09-03 LAB — CBC WITH DIFFERENTIAL/PLATELET
Basophils Absolute: 0 10*3/uL (ref 0.0–0.1)
Basophils Relative: 0 % (ref 0–1)
EOS ABS: 0.1 10*3/uL (ref 0.0–0.7)
Eosinophils Relative: 3 % (ref 0–5)
HCT: 40.9 % (ref 36.0–46.0)
Hemoglobin: 12.8 g/dL (ref 12.0–15.0)
LYMPHS ABS: 0.9 10*3/uL (ref 0.7–4.0)
LYMPHS PCT: 22 % (ref 12–46)
MCH: 26.7 pg (ref 26.0–34.0)
MCHC: 31.3 g/dL (ref 30.0–36.0)
MCV: 85.4 fL (ref 78.0–100.0)
Monocytes Absolute: 0.4 10*3/uL (ref 0.1–1.0)
Monocytes Relative: 10 % (ref 3–12)
NEUTROS PCT: 65 % (ref 43–77)
Neutro Abs: 2.8 10*3/uL (ref 1.7–7.7)
PLATELETS: 201 10*3/uL (ref 150–400)
RBC: 4.79 MIL/uL (ref 3.87–5.11)
RDW: 14.9 % (ref 11.5–15.5)
WBC: 4.2 10*3/uL (ref 4.0–10.5)

## 2014-09-03 LAB — URINE MICROSCOPIC-ADD ON

## 2014-09-03 LAB — URINALYSIS, ROUTINE W REFLEX MICROSCOPIC
Bilirubin Urine: NEGATIVE
Glucose, UA: NEGATIVE mg/dL
HGB URINE DIPSTICK: NEGATIVE
KETONES UR: NEGATIVE mg/dL
Nitrite: NEGATIVE
PROTEIN: NEGATIVE mg/dL
Specific Gravity, Urine: 1.027 (ref 1.005–1.030)
Urobilinogen, UA: 1 mg/dL (ref 0.0–1.0)
pH: 6 (ref 5.0–8.0)

## 2014-09-03 LAB — LIPASE, BLOOD: Lipase: 13 U/L — ABNORMAL LOW (ref 22–51)

## 2014-09-03 MED ORDER — PROMETHAZINE HCL 25 MG PO TABS: 25.0000 mg | ORAL_TABLET | Freq: Four times a day (QID) | ORAL | Status: DC | PRN

## 2014-09-03 MED ORDER — HYDROMORPHONE HCL 1 MG/ML IJ SOLN
1.0000 mg | Freq: Once | INTRAMUSCULAR | Status: AC
  Administered 2014-09-03: 1 mg via INTRAVENOUS
  Filled 2014-09-03: qty 1

## 2014-09-03 MED ORDER — PROMETHAZINE HCL 25 MG/ML IJ SOLN
25.0000 mg | Freq: Once | INTRAMUSCULAR | Status: AC
  Administered 2014-09-03: 25 mg via INTRAVENOUS
  Filled 2014-09-03: qty 1

## 2014-09-03 NOTE — Discharge Instructions (Signed)
Migraine Headache  A migraine headache is an intense, throbbing pain on one or both sides of your head. A migraine can last for 30 minutes to several hours.  CAUSES   The exact cause of a migraine headache is not always known. However, a migraine may be caused when nerves in the brain become irritated and release chemicals that cause inflammation. This causes pain.  Certain things may also trigger migraines, such as:   Alcohol.   Smoking.   Stress.   Menstruation.   Aged cheeses.   Foods or drinks that contain nitrates, glutamate, aspartame, or tyramine.   Lack of sleep.   Chocolate.   Caffeine.   Hunger.   Physical exertion.   Fatigue.   Medicines used to treat chest pain (nitroglycerine), birth control pills, estrogen, and some blood pressure medicines.  SIGNS AND SYMPTOMS   Pain on one or both sides of your head.   Pulsating or throbbing pain.   Severe pain that prevents daily activities.   Pain that is aggravated by any physical activity.   Nausea, vomiting, or both.   Dizziness.   Pain with exposure to bright lights, loud noises, or activity.   General sensitivity to bright lights, loud noises, or smells.  Before you get a migraine, you may get warning signs that a migraine is coming (aura). An aura may include:   Seeing flashing lights.   Seeing bright spots, halos, or zigzag lines.   Having tunnel vision or blurred vision.   Having feelings of numbness or tingling.   Having trouble talking.   Having muscle weakness.  DIAGNOSIS   A migraine headache is often diagnosed based on:   Symptoms.   Physical exam.   A CT scan or MRI of your head. These imaging tests cannot diagnose migraines, but they can help rule out other causes of headaches.  TREATMENT  Medicines may be given for pain and nausea. Medicines can also be given to help prevent recurrent migraines.   HOME CARE INSTRUCTIONS   Only take over-the-counter or prescription medicines for pain or discomfort as directed by your  health care provider. The use of long-term narcotics is not recommended.   Lie down in a dark, quiet room when you have a migraine.   Keep a journal to find out what may trigger your migraine headaches. For example, write down:   What you eat and drink.   How much sleep you get.   Any change to your diet or medicines.   Limit alcohol consumption.   Quit smoking if you smoke.   Get 7-9 hours of sleep, or as recommended by your health care provider.   Limit stress.   Keep lights dim if bright lights bother you and make your migraines worse.  SEEK IMMEDIATE MEDICAL CARE IF:    Your migraine becomes severe.   You have a fever.   You have a stiff neck.   You have vision loss.   You have muscular weakness or loss of muscle control.   You start losing your balance or have trouble walking.   You feel faint or pass out.   You have severe symptoms that are different from your first symptoms.  MAKE SURE YOU:    Understand these instructions.   Will watch your condition.   Will get help right away if you are not doing well or get worse.  Document Released: 02/21/2005 Document Revised: 07/08/2013 Document Reviewed: 10/29/2012  Exit[employer] Patient Information 2015 [employer]. This information   is not intended to replace advice given to you by your health care provider. Make sure you discuss any questions you have with your health care provider.  Nausea and Vomiting  Nausea is a sick feeling that often comes before throwing up (vomiting). Vomiting is a reflex where stomach contents come out of your mouth. Vomiting can cause severe loss of body fluids (dehydration). Children and elderly adults can become dehydrated quickly, especially if they also have diarrhea. Nausea and vomiting are symptoms of a condition or disease. It is important to find the cause of your symptoms.  CAUSES    Direct irritation of the stomach lining. This irritation can result from increased acid production (gastroesophageal reflux  disease), infection, food poisoning, taking certain medicines (such as nonsteroidal anti-inflammatory drugs), alcohol use, or tobacco use.   Signals from the brain.These signals could be caused by a headache, heat exposure, an inner ear disturbance, increased pressure in the brain from injury, infection, a tumor, or a concussion, pain, emotional stimulus, or metabolic problems.   An obstruction in the gastrointestinal tract (bowel obstruction).   Illnesses such as diabetes, hepatitis, gallbladder problems, appendicitis, kidney problems, cancer, sepsis, atypical symptoms of a heart attack, or eating disorders.   Medical treatments such as chemotherapy and radiation.   Receiving medicine that makes you sleep (general anesthetic) during surgery.  DIAGNOSIS  Your caregiver may ask for tests to be done if the problems do not improve after a few days. Tests may also be done if symptoms are severe or if the reason for the nausea and vomiting is not clear. Tests may include:   Urine tests.   Blood tests.   Stool tests.   Cultures (to look for evidence of infection).   X-rays or other imaging studies.  Test results can help your caregiver make decisions about treatment or the need for additional tests.  TREATMENT  You need to stay well hydrated. Drink frequently but in small amounts.You may wish to drink water, sports drinks, clear broth, or eat frozen ice pops or gelatin dessert to help stay hydrated.When you eat, eating slowly may help prevent nausea.There are also some antinausea medicines that may help prevent nausea.  HOME CARE INSTRUCTIONS    Take all medicine as directed by your caregiver.   If you do not have an appetite, do not force yourself to eat. However, you must continue to drink fluids.   If you have an appetite, eat a normal diet unless your caregiver tells you differently.   Eat a variety of complex carbohydrates (rice, wheat, potatoes, bread), lean meats, yogurt, fruits, and  vegetables.   Avoid high-fat foods because they are more difficult to digest.   Drink enough water and fluids to keep your urine clear or pale yellow.   If you are dehydrated, ask your caregiver for specific rehydration instructions. Signs of dehydration may include:   Severe thirst.   Dry lips and mouth.   Dizziness.   Dark urine.   Decreasing urine frequency and amount.   Confusion.   Rapid breathing or pulse.  SEEK IMMEDIATE MEDICAL CARE IF:    You have blood or brown flecks (like coffee grounds) in your vomit.   You have black or bloody stools.   You have a severe headache or stiff neck.   You are confused.   You have severe abdominal pain.   You have chest pain or trouble breathing.   You do not urinate at least once every 8 hours.     You develop cold or clammy skin.   You continue to vomit for longer than 24 to 48 hours.   You have a fever.  MAKE SURE YOU:    Understand these instructions.   Will watch your condition.   Will get help right away if you are not doing well or get worse.  Document Released: 02/21/2005 Document Revised: 05/16/2011 Document Reviewed: 07/21/2010  [employer] Patient Information 2015 [employer]. This information is not intended to replace advice given to you by your health care provider. Make sure you discuss any questions you have with your health care provider.

## 2014-09-03 NOTE — ED Notes (Signed)
MD at bedside. 

## 2014-09-03 NOTE — ED Notes (Signed)
Pt c/o headache x3 days with nausea, started vomiting today with severe abdominal pain

## 2014-09-03 NOTE — ED Provider Notes (Signed)
TIME SEEN: 1:55 AM  CHIEF COMPLAINT: Headache, nausea and vomiting, abdominal pain  HPI: Pt is a 59 y.o. female with history of migraine headaches, hypertension, colon cancer status post resection and chemotherapy currently in remission for the past 4 years who presents emergency department 3 days of diffuse, throbbing, moderate headache that feels similar to her prior migraines. She has had nausea with these headaches. States today she began vomiting. No diarrhea. Because of the frequent episodes of vomiting she is complaining of diffuse abdominal soreness that is moderate in nature. Denies any fever. States her granddaughter has similar symptoms. She is status post cholecystectomy and hysterectomy as well as colon resection. Denies dysuria, hematuria, vaginal bleeding or discharge. She denies any recent head injury. Denies numbness, tingling or focal weakness. Denies being on anticoagulation. Denies radiation of pain. Denies aggravating or relieving factors.  ROS: See HPI Constitutional: no fever  Eyes: no drainage  ENT: no runny nose   Cardiovascular:  no chest pain  Resp: no SOB  GI: vomiting GU: no dysuria Integumentary: no rash  Allergy: no hives  Musculoskeletal: no leg swelling  Neurological: no slurred speech ROS otherwise negative  PAST MEDICAL HISTORY/PAST SURGICAL HISTORY:  Past Medical History  Diagnosis Date  . Cancer   . Hypertension   . Anemia   . GERD (gastroesophageal reflux disease)   . Tension headache   . Colon cancer   . Migraine     MEDICATIONS:  Prior to Admission medications   Medication Sig Start Date End Date Taking? Authorizing Provider  AmLODIPine Besylate (NORVASC PO) Take by mouth.    Historical Provider, MD  captopril (CAPOTEN) 25 MG tablet Take 25 mg by mouth 3 (three) times daily.    Historical Provider, MD  cloNIDine (CATAPRES) 0.2 MG tablet Take 0.2 mg by mouth 2 (two) times daily.    Historical Provider, MD  esomeprazole (NEXIUM) 40 MG  capsule Take 40 mg by mouth daily before breakfast.      Historical Provider, MD  ferrous sulfate 325 (65 FE) MG tablet Take 325 mg by mouth 3 (three) times daily.     Historical Provider, MD  furosemide (LASIX) 20 MG tablet Take 20 mg by mouth.    Historical Provider, MD  metoprolol (LOPRESSOR) 50 MG tablet Take 50 mg by mouth 2 (two) times daily.    Historical Provider, MD  pantoprazole (PROTONIX) 20 MG tablet Take 20 mg by mouth daily.    Historical Provider, MD  promethazine (PHENERGAN) 25 MG tablet Take 1 tablet (25 mg total) by mouth every 6 (six) hours as needed for nausea or vomiting. 07/15/13   Fransico Meadow, PA-C  sucralfate (CARAFATE) 1 G tablet Take 1 tablet (1 g total) by mouth 4 (four) times daily -  with meals and at bedtime. 11/02/13   Dorie Rank, MD    ALLERGIES:  Allergies  Allergen Reactions  . Nsaids Other (See Comments)    Gastric irritation    SOCIAL HISTORY:  History  Substance Use Topics  . Smoking status: Never Smoker   . Smokeless tobacco: Not on file  . Alcohol Use: No    FAMILY HISTORY: No family history on file.  EXAM: BP 198/118 mmHg  Pulse 104  Temp(Src) 97.7 F (36.5 C) (Oral)  Resp 20  Ht 5\' 3"  (1.6 m)  Wt 226 lb (102.513 kg)  BMI 40.04 kg/m2  SpO2 99% CONSTITUTIONAL: Alert and oriented and responds appropriately to questions. Well-appearing; well-nourished, nontoxic-appearing HEAD: Normocephalic EYES: Conjunctivae clear,  PERRL ENT: normal nose; no rhinorrhea; moist mucous membranes; pharynx without lesions noted NECK: Supple, no meningismus, no LAD  CARD: RRR; S1 and S2 appreciated; no murmurs, no clicks, no rubs, no gallops RESP: Normal chest excursion without splinting or tachypnea; breath sounds clear and equal bilaterally; no wheezes, no rhonchi, no rales, no hypoxia or respiratory distress, speaking full sentences ABD/GI: Normal bowel sounds; non-distended; soft, very minimally tender to palpation throughout her entire abdomen without  focality, no rebound, no guarding, no peritoneal signs, no tenderness at McBurney's point BACK:  The back appears normal and is non-tender to palpation, there is no CVA tenderness EXT: Normal ROM in all joints; non-tender to palpation; no edema; normal capillary refill; no cyanosis, no calf tenderness or swelling    SKIN: Normal color for age and race; warm NEURO: Moves all extremities equally, sensation to light touch intact diffusely, cranial nerves II through XII intact PSYCH: The patient's mood and manner are appropriate. Grooming and personal hygiene are appropriate.  MEDICAL DECISION MAKING: Patient here with complaints of her typical migraine headache. States that Dilaudid and Phenergan have helped her in the past. Also complaining of multiple episodes of vomiting at home today, abdominal pain.  Patient's abdominal exam is benign. I do not feel she needs emergent imaging. We'll obtain labs, urine. Will treat her symptoms with Dilaudid, Phenergan and reassess.  ED PROGRESS: Patient's labs are unremarkable. Urine shows trace leukocytes but also many bacteria and squamous cells. Suspect dirty catch. She is not having any urinary symptoms at this time. Patient reports feeling better and has been able to drink. No vomiting in the ED. I feel she is safe to be discharged home. We'll discharge with prescription for Phenergan. Discussed return precautions and importance of close outpatient follow-up. She verbalized understanding and is comfortable with this plan.     [place], DO 09/03/14 [phone]

## 2014-09-08 ENCOUNTER — Encounter (HOSPITAL_BASED_OUTPATIENT_CLINIC_OR_DEPARTMENT_OTHER): Payer: Self-pay | Admitting: *Deleted

## 2014-09-08 ENCOUNTER — Emergency Department (HOSPITAL_BASED_OUTPATIENT_CLINIC_OR_DEPARTMENT_OTHER)
Admission: EM | Admit: 2014-09-08 | Discharge: 2014-09-08 | Disposition: A | Discharge status: Home / Self Care | Payer: Medicaid Other | Attending: Emergency Medicine | Admitting: Emergency Medicine

## 2014-09-08 DIAGNOSIS — Z79899 Other long term (current) drug therapy: Secondary | ICD-10-CM | POA: Insufficient documentation

## 2014-09-08 DIAGNOSIS — D649 Anemia, unspecified: Secondary | ICD-10-CM | POA: Insufficient documentation

## 2014-09-08 DIAGNOSIS — K219 Gastro-esophageal reflux disease without esophagitis: Secondary | ICD-10-CM | POA: Insufficient documentation

## 2014-09-08 DIAGNOSIS — I10 Essential (primary) hypertension: Secondary | ICD-10-CM | POA: Diagnosis not present

## 2014-09-08 DIAGNOSIS — Z85038 Personal history of other malignant neoplasm of large intestine: Secondary | ICD-10-CM | POA: Diagnosis not present

## 2014-09-08 DIAGNOSIS — G43809 Other migraine, not intractable, without status migrainosus: Secondary | ICD-10-CM | POA: Insufficient documentation

## 2014-09-08 DIAGNOSIS — R51 Headache: Secondary | ICD-10-CM | POA: Diagnosis present

## 2014-09-08 LAB — CBC WITH DIFFERENTIAL/PLATELET
BASOS PCT: 0 % (ref 0–1)
Basophils Absolute: 0 10*3/uL (ref 0.0–0.1)
Eosinophils Absolute: 0.1 10*3/uL (ref 0.0–0.7)
Eosinophils Relative: 1 % (ref 0–5)
HCT: 45.8 % (ref 36.0–46.0)
Hemoglobin: 14.7 g/dL (ref 12.0–15.0)
Lymphocytes Relative: 20 % (ref 12–46)
Lymphs Abs: 1 10*3/uL (ref 0.7–4.0)
MCH: 26.4 pg (ref 26.0–34.0)
MCHC: 32.1 g/dL (ref 30.0–36.0)
MCV: 82.4 fL (ref 78.0–100.0)
MONOS PCT: 8 % (ref 3–12)
Monocytes Absolute: 0.4 10*3/uL (ref 0.1–1.0)
Neutro Abs: 3.5 10*3/uL (ref 1.7–7.7)
Neutrophils Relative %: 71 % (ref 43–77)
Platelets: 218 10*3/uL (ref 150–400)
RBC: 5.56 MIL/uL — AB (ref 3.87–5.11)
RDW: 14.5 % (ref 11.5–15.5)
WBC: 5 10*3/uL (ref 4.0–10.5)

## 2014-09-08 LAB — BASIC METABOLIC PANEL
Anion gap: 9 (ref 5–15)
BUN: 10 mg/dL (ref 6–20)
CHLORIDE: 102 mmol/L (ref 101–111)
CO2: 28 mmol/L (ref 22–32)
Calcium: 8.9 mg/dL (ref 8.9–10.3)
Creatinine, Ser: 0.56 mg/dL (ref 0.44–1.00)
GFR calc Af Amer: >60 mL/min (ref >60)
GFR calc non Af Amer: >60 mL/min (ref >60)
Glucose, Bld: 92 mg/dL (ref 65–99)
POTASSIUM: 3.7 mmol/L (ref 3.5–5.1)
Sodium: 139 mmol/L (ref 135–145)

## 2014-09-08 MED ORDER — TRAMADOL HCL 50 MG PO TABS: 50.0000 mg | ORAL_TABLET | Freq: Four times a day (QID) | ORAL | Status: DC | PRN

## 2014-09-08 MED ORDER — PROCHLORPERAZINE EDISYLATE 5 MG/ML IJ SOLN
10.0000 mg | Freq: Once | INTRAMUSCULAR | Status: AC
  Administered 2014-09-08: 10 mg via INTRAVENOUS
  Filled 2014-09-08: qty 2

## 2014-09-08 MED ORDER — CLONIDINE HCL 0.1 MG PO TABS
0.2000 mg | ORAL_TABLET | Freq: Once | ORAL | Status: AC
  Administered 2014-09-08: 0.2 mg via ORAL
  Filled 2014-09-08: qty 2

## 2014-09-08 MED ORDER — DIPHENHYDRAMINE HCL 50 MG/ML IJ SOLN
25.0000 mg | Freq: Once | INTRAMUSCULAR | Status: AC
  Administered 2014-09-08: 25 mg via INTRAVENOUS
  Filled 2014-09-08: qty 1

## 2014-09-08 MED ORDER — OXYCODONE-ACETAMINOPHEN 5-325 MG PO TABS
1.0000 | ORAL_TABLET | Freq: Once | ORAL | Status: AC
  Administered 2014-09-08: 1 via ORAL
  Filled 2014-09-08: qty 1

## 2014-09-08 MED ORDER — PROMETHAZINE HCL 25 MG/ML IJ SOLN
25.0000 mg | Freq: Once | INTRAMUSCULAR | Status: AC
  Administered 2014-09-08: 25 mg via INTRAVENOUS
  Filled 2014-09-08: qty 1

## 2014-09-08 MED ORDER — HYDROMORPHONE HCL 1 MG/ML IJ SOLN
1.0000 mg | Freq: Once | INTRAMUSCULAR | Status: AC
  Administered 2014-09-08: 1 mg via INTRAVENOUS
  Filled 2014-09-08: qty 1

## 2014-09-08 NOTE — ED Notes (Signed)
Headache. States she was here a few days ago for same and given pain and nausea medication.

## 2014-09-08 NOTE — ED Provider Notes (Addendum)
CSN: [id number]     Arrival date & time 09/08/14  1336 History   First MD Initiated Contact with Patient 09/08/14 1343     Chief Complaint  Patient presents with  . Headache     (Consider location/radiation/quality/duration/timing/severity/associated sxs/prior Treatment) HPI Comments: Patient presents to the emergency department for evaluation of headache. Patient reports that she has a history of migraine headaches. Patient was seen in the emergency department last week for similar. She reports that her headache resolved with treatment but came back. She reports a diffuse throbbing headache across the frontal region. She has nausea but no vomiting. There is no fever, neck pain or stiffness.  She is found to be very hypertensive here in the ER. She reports taking all of her medications this morning. There is no chest pain or shortness of breath. Reports that she is under a great deal of stress. She reports that her husband and her son died within the last 14 days.  Patient is a 59 y.o. female presenting with headaches.  Headache   Past Medical History  Diagnosis Date  . Cancer   . Hypertension   . Anemia   . GERD (gastroesophageal reflux disease)   . Tension headache   . Colon cancer   . Migraine    Past Surgical History  Procedure Laterality Date  . Cholecystectomy    . Abdominal hysterectomy    . Colon surgery     No family history on file. History  Substance Use Topics  . Smoking status: Never Smoker   . Smokeless tobacco: Not on file  . Alcohol Use: No   OB History    No data available     Review of Systems  Neurological: Positive for headaches.  All other systems reviewed and are negative.     Allergies  Nsaids  Home Medications   Prior to Admission medications   Medication Sig Start Date End Date Taking? Authorizing Provider  AmLODIPine Besylate (NORVASC PO) Take by mouth.    Historical Provider, MD  captopril (CAPOTEN) 25 MG tablet Take 25 mg by mouth  3 (three) times daily.    Historical Provider, MD  cloNIDine (CATAPRES) 0.2 MG tablet Take 0.2 mg by mouth 2 (two) times daily.    Historical Provider, MD  esomeprazole (NEXIUM) 40 MG capsule Take 40 mg by mouth daily before breakfast.      Historical Provider, MD  ferrous sulfate 325 (65 FE) MG tablet Take 325 mg by mouth 3 (three) times daily.     Historical Provider, MD  furosemide (LASIX) 20 MG tablet Take 20 mg by mouth.    Historical Provider, MD  metoprolol (LOPRESSOR) 50 MG tablet Take 50 mg by mouth 2 (two) times daily.    Historical Provider, MD  pantoprazole (PROTONIX) 20 MG tablet Take 20 mg by mouth daily.    Historical Provider, MD  promethazine (PHENERGAN) 25 MG tablet Take 1 tablet (25 mg total) by mouth every 6 (six) hours as needed for nausea or vomiting. 09/03/14   Kristen N Ward, DO  sucralfate (CARAFATE) 1 G tablet Take 1 tablet (1 g total) by mouth 4 (four) times daily -  with meals and at bedtime. 11/02/13   Dorie Rank, MD  traMADol (ULTRAM) 50 MG tablet Take 1 tablet (50 mg total) by mouth every 6 (six) hours as needed. 09/08/14   Orpah Greek, MD   BP 184/100 mmHg  Pulse 88  Temp(Src) 97.9 F (36.6 C) (Oral)  Resp 21  Ht 5\' 3"  (1.6 m)  Wt 226 lb (102.513 kg)  BMI 40.04 kg/m2  SpO2 96% Physical Exam  Constitutional: She is oriented to person, place, and time. She appears well-developed and well-nourished. No distress.  HENT:  Head: Normocephalic and atraumatic.  Right Ear: Hearing normal.  Left Ear: Hearing normal.  Nose: Nose normal.  Mouth/Throat: Oropharynx is clear and moist and mucous membranes are normal.  Eyes: Conjunctivae and EOM are normal. Pupils are equal, round, and reactive to light.  Neck: Normal range of motion. Neck supple.  Cardiovascular: Regular rhythm, S1 normal and S2 normal.  Exam reveals no gallop and no friction rub.   No murmur heard. Pulmonary/Chest: Effort normal and breath sounds normal. No respiratory distress. She exhibits no  tenderness.  Abdominal: Soft. Normal appearance and bowel sounds are normal. There is no hepatosplenomegaly. There is no tenderness. There is no rebound, no guarding, no tenderness at McBurney's point and negative Murphy's sign. No hernia.  Musculoskeletal: Normal range of motion.  Neurological: She is alert and oriented to person, place, and time. She has normal strength. No cranial nerve deficit or sensory deficit. Coordination normal. GCS eye subscore is 4. GCS verbal subscore is 5. GCS motor subscore is 6.  Skin: Skin is warm, dry and intact. No rash noted. No cyanosis.  Psychiatric: Her speech is normal and behavior is normal. Thought content normal.  tearful  Nursing note and vitals reviewed.   ED Course  Procedures (including critical care time) Labs Review Labs Reviewed  CBC WITH DIFFERENTIAL/PLATELET - Abnormal; Notable for the following:    RBC 5.56 (*)    All other components within normal limits  BASIC METABOLIC PANEL    Imaging Review No results found.   EKG Interpretation   Date/Time:  Monday September 08 2014 13:46:28 EDT Ventricular Rate:  98 PR Interval:  136 QRS Duration: 88 QT Interval:  354 QTC Calculation: 451 R Axis:   56 Text Interpretation:  Normal sinus rhythm Right atrial enlargement  Nonspecific ST and T wave abnormality Abnormal ECG No significant change  since last tracing Confirmed by Dina Mobley  MD, [place] [phone]) on  09/08/2014 1:55:09 PM      MDM   Final diagnoses:  Other type of migraine  Essential hypertension    Patient presents to the emergency department for evaluation of migraine headache. Patient is currently experiencing a headache that is similar to previous migraines. Patient does not have any unusual features compared to previous migraines. Patient has normal neurologic examination. There are no unusual features, such as unusual intensity or sudden onset. As this headache is similar to previous migraines, there is no concern for  subarachnoid hemorrhage or other etiology. Patient therefore does not require imaging. Patient treated as migraine headache.  Patient continues to tell nursing staff that she needs dilaudid. I did discuss with her that IV narcotics do not have a place for treatment of migraine headaches. I informed her about rebound migraines and addiction. She became very angry when she did not get Dilaudid. I recommended that she follow-up with her doctor to be referred to a headache specialist.  In addition to the headache, however, patient is hypertensive. She does have a history of significant hypertension, maintained on multiple medications. Patient was given clonidine with some improvement. When she became angry about the Dilantin situation, however, her blood pressure did increase again. I do not believe her headache is secondary to the hypertension. No cardiac symptoms. Basic labs including BUN and creatinine have  been evaluated.   Orpah Greek, MD 09/08/14 [place], MD 09/08/14 [phone]

## 2014-09-08 NOTE — ED Notes (Signed)
Pt states while watching rn push medications through running iv line, "it don't help as much when you push it real slow like that." explained to pt that I push medications per [facility] protocol and iv medication instructions. Pt verbalizes understanding.

## 2014-09-08 NOTE — ED Provider Notes (Signed)
Assumed care from Dr. Betsey Holiday.  Patient with typical migraine headache. BP elevated but improving. nonfocal neuro exam.  Labs pending.  Labs reassuring.  Headache has improved with treatment in the ED.  BP improved. Follow up with neurology and PCP.  BP 145/79 mmHg  Pulse 79  Temp(Src) 97.9 F (36.6 C) (Oral)  Resp 19  Ht 5\' 3"  (1.6 m)  Wt 226 lb (102.513 kg)  BMI 40.04 kg/m2  SpO2 96%   Ezequiel Essex, MD 09/08/14 1650

## 2014-09-08 NOTE — Discharge Instructions (Signed)
Hypertension Hypertension is another name for high blood pressure. High blood pressure forces your heart to work harder to pump blood. A blood pressure reading has two numbers, which includes a higher number over a lower number (example: 110/72). HOME CARE   Have your blood pressure rechecked by your doctor.  Only take medicine as told by your doctor. Follow the directions carefully. The medicine does not work as well if you skip doses. Skipping doses also puts you at risk for problems.  Do not smoke.  Monitor your blood pressure at home as told by your doctor. GET HELP IF:  You think you are having a reaction to the medicine you are taking.  You have repeat headaches or feel dizzy.  You have puffiness (swelling) in your ankles.  You have trouble with your vision. GET HELP RIGHT AWAY IF:   You get a very bad headache and are confused.  You feel weak, numb, or faint.  You get chest or belly (abdominal) pain.  You throw up (vomit).  You cannot breathe very well. MAKE SURE YOU:   Understand these instructions.  Will watch your condition.  Will get help right away if you are not doing well or get worse. Document Released: 08/10/2007 Document Revised: 02/26/2013 Document Reviewed: 12/14/2012 [facility] Patient Information 2015 [place], [place]. This information is not intended to replace advice given to you by your health care provider. Make sure you discuss any questions you have with your health care provider.  Migraine Headache A migraine headache is an intense, throbbing pain on one or both sides of your head. A migraine can last for 30 minutes to several hours. CAUSES  The exact cause of a migraine headache is not always known. However, a migraine may be caused when nerves in the brain become irritated and release chemicals that cause inflammation. This causes pain. Certain things may also trigger migraines, such  as:  Alcohol.  Smoking.  Stress.  Menstruation.  Aged cheeses.  Foods or drinks that contain nitrates, glutamate, aspartame, or tyramine.  Lack of sleep.  Chocolate.  Caffeine.  Hunger.  Physical exertion.  Fatigue.  Medicines used to treat chest pain (nitroglycerine), birth control pills, estrogen, and some blood pressure medicines. SIGNS AND SYMPTOMS  Pain on one or both sides of your head.  Pulsating or throbbing pain.  Severe pain that prevents daily activities.  Pain that is aggravated by any physical activity.  Nausea, vomiting, or both.  Dizziness.  Pain with exposure to bright lights, loud noises, or activity.  General sensitivity to bright lights, loud noises, or smells. Before you get a migraine, you may get warning signs that a migraine is coming (aura). An aura may include:  Seeing flashing lights.  Seeing bright spots, halos, or zigzag lines.  Having tunnel vision or blurred vision.  Having feelings of numbness or tingling.  Having trouble talking.  Having muscle weakness. DIAGNOSIS  A migraine headache is often diagnosed based on:  Symptoms.  Physical exam.  A CT scan or MRI of your head. These imaging tests cannot diagnose migraines, but they can help rule out other causes of headaches. TREATMENT Medicines may be given for pain and nausea. Medicines can also be given to help prevent recurrent migraines.  HOME CARE INSTRUCTIONS  Only take over-the-counter or prescription medicines for pain or discomfort as directed by your health care provider. The use of long-term narcotics is not recommended.  Lie down in a dark, quiet room when you have a migraine.  Keep a journal to find out what may trigger your migraine headaches. For example, write down:  What you eat and drink.  How much sleep you get.  Any change to your diet or medicines.  Limit alcohol consumption.  Quit smoking if you smoke.  Get 7-9 hours of sleep, or as  recommended by your health care provider.  Limit stress.  Keep lights dim if bright lights bother you and make your migraines worse. SEEK IMMEDIATE MEDICAL CARE IF:   Your migraine becomes severe.  You have a fever.  You have a stiff neck.  You have vision loss.  You have muscular weakness or loss of muscle control.  You start losing your balance or have trouble walking.  You feel faint or pass out.  You have severe symptoms that are different from your first symptoms. MAKE SURE YOU:   Understand these instructions.  Will watch your condition.  Will get help right away if you are not doing well or get worse. Document Released: 02/21/2005 Document Revised: 07/08/2013 Document Reviewed: 10/29/2012 [facility] Patient Information 2015 [place], [place]. This information is not intended to replace advice given to you by your health care provider. Make sure you discuss any questions you have with your health care provider.

## 2014-09-08 NOTE — ED Notes (Signed)
Pt states migraine cocktail does not help her h/a's. Dr Sandrea Matte aware and in to talk with pt.

## 2014-10-18 ENCOUNTER — Encounter (HOSPITAL_BASED_OUTPATIENT_CLINIC_OR_DEPARTMENT_OTHER): Payer: Self-pay | Admitting: *Deleted

## 2014-10-18 ENCOUNTER — Emergency Department (HOSPITAL_BASED_OUTPATIENT_CLINIC_OR_DEPARTMENT_OTHER)
Admission: EM | Admit: 2014-10-18 | Discharge: 2014-10-18 | Disposition: A | Discharge status: Home / Self Care | Payer: Medicaid Other | Attending: Physician Assistant | Admitting: Physician Assistant

## 2014-10-18 DIAGNOSIS — K219 Gastro-esophageal reflux disease without esophagitis: Secondary | ICD-10-CM | POA: Diagnosis not present

## 2014-10-18 DIAGNOSIS — Z859 Personal history of malignant neoplasm, unspecified: Secondary | ICD-10-CM | POA: Insufficient documentation

## 2014-10-18 DIAGNOSIS — H109 Unspecified conjunctivitis: Secondary | ICD-10-CM

## 2014-10-18 DIAGNOSIS — I10 Essential (primary) hypertension: Secondary | ICD-10-CM | POA: Insufficient documentation

## 2014-10-18 DIAGNOSIS — D649 Anemia, unspecified: Secondary | ICD-10-CM | POA: Diagnosis not present

## 2014-10-18 DIAGNOSIS — Z85038 Personal history of other malignant neoplasm of large intestine: Secondary | ICD-10-CM | POA: Insufficient documentation

## 2014-10-18 DIAGNOSIS — R51 Headache: Secondary | ICD-10-CM | POA: Diagnosis present

## 2014-10-18 DIAGNOSIS — Z79899 Other long term (current) drug therapy: Secondary | ICD-10-CM | POA: Insufficient documentation

## 2014-10-18 DIAGNOSIS — G43009 Migraine without aura, not intractable, without status migrainosus: Secondary | ICD-10-CM | POA: Insufficient documentation

## 2014-10-18 MED ORDER — PROMETHAZINE HCL 25 MG PO TABS
25.0000 mg | ORAL_TABLET | Freq: Once | ORAL | Status: AC
  Administered 2014-10-18: 25 mg via ORAL
  Filled 2014-10-18: qty 1

## 2014-10-18 MED ORDER — TETRACAINE HCL 0.5 % OP SOLN
2.0000 [drp] | Freq: Once | OPHTHALMIC | Status: AC
  Administered 2014-10-18: 2 [drp] via OPHTHALMIC
  Filled 2014-10-18: qty 2

## 2014-10-18 MED ORDER — HYDROMORPHONE HCL 1 MG/ML IJ SOLN
1.0000 mg | Freq: Once | INTRAMUSCULAR | Status: AC
  Administered 2014-10-18: 1 mg via INTRAMUSCULAR
  Filled 2014-10-18: qty 1

## 2014-10-18 MED ORDER — TETRACAINE HCL 0.5 % OP SOLN
OPHTHALMIC | Status: AC
  Administered 2014-10-18: 2 [drp] via OPHTHALMIC
  Filled 2014-10-18: qty 2

## 2014-10-18 NOTE — ED Provider Notes (Signed)
CSN: [id number]     Arrival date & time 10/18/14  1228 History   First MD Initiated Contact with Patient 10/18/14 1302     Chief Complaint  Patient presents with  . Headache  . Eye Drainage     (Consider location/radiation/quality/duration/timing/severity/associated sxs/prior Treatment) HPI Comments: 59 year old female with a past medical history of hypertension, anemia, GERD, colon cancer, migraine and tension headaches presenting with gradual right-sided headache 2 days. Has remained constant, 10/10 with associated photophobia, phonophobia and nausea. No vomiting. Feels the same as her prior migraines. Takes captopril for migraines with no relief. Yesterday, she woke up in her left eye was red and painful draining clear, watery drainage. Reports feeling pressure behind her eye. Denies foreign body sensation, exudate. No vision change. Denies fever, chills, neck pain or stiffness. Also reporting bilateral lower extremity edema worsening over the past 3 days. Takes Lasix for edema. Denies chest pain or shortness of breath.  Patient is a 59 y.o. female presenting with headaches. The history is provided by the patient.  Headache Associated symptoms: eye pain, nausea and photophobia     Past Medical History  Diagnosis Date  . Cancer   . Hypertension   . Anemia   . GERD (gastroesophageal reflux disease)   . Tension headache   . Colon cancer   . Migraine    Past Surgical History  Procedure Laterality Date  . Cholecystectomy    . Abdominal hysterectomy    . Colon surgery     No family history on file. Social History  Substance Use Topics  . Smoking status: Never Smoker   . Smokeless tobacco: Never Used  . Alcohol Use: No   OB History    No data available     Review of Systems  Eyes: Positive for photophobia, pain and redness.  Gastrointestinal: Positive for nausea.  Neurological: Positive for headaches.  All other systems reviewed and are negative.     Allergies   Nsaids  Home Medications   Prior to Admission medications   Medication Sig Start Date End Date Taking? Authorizing Provider  AmLODIPine Besylate (NORVASC PO) Take by mouth.   Yes Historical Provider, MD  captopril (CAPOTEN) 25 MG tablet Take 25 mg by mouth 3 (three) times daily.   Yes Historical Provider, MD  cloNIDine (CATAPRES) 0.2 MG tablet Take 0.2 mg by mouth 2 (two) times daily.   Yes Historical Provider, MD  ferrous sulfate 325 (65 FE) MG tablet Take 325 mg by mouth 3 (three) times daily.    Yes Historical Provider, MD  furosemide (LASIX) 20 MG tablet Take 20 mg by mouth.   Yes Historical Provider, MD  metoprolol (LOPRESSOR) 50 MG tablet Take 50 mg by mouth 2 (two) times daily.   Yes Historical Provider, MD  pantoprazole (PROTONIX) 20 MG tablet Take 20 mg by mouth daily.   Yes Historical Provider, MD  promethazine (PHENERGAN) 25 MG tablet Take 1 tablet (25 mg total) by mouth every 6 (six) hours as needed for nausea or vomiting. 09/03/14  Yes Kristen N Ward, DO  esomeprazole (NEXIUM) 40 MG capsule Take 40 mg by mouth daily before breakfast.      Historical Provider, MD  sucralfate (CARAFATE) 1 G tablet Take 1 tablet (1 g total) by mouth 4 (four) times daily -  with meals and at bedtime. 11/02/13   Dorie Rank, MD  traMADol (ULTRAM) 50 MG tablet Take 1 tablet (50 mg total) by mouth every 6 (six) hours as needed. 09/08/14  Orpah Greek, MD   BP 130/98 mmHg  Pulse 77  Temp(Src) 98.3 F (36.8 C) (Oral)  Resp 18  Ht 5\' 3"  (1.6 m)  Wt 220 lb (99.791 kg)  BMI 38.98 kg/m2  SpO2 100% Physical Exam  Constitutional: She is oriented to person, place, and time. She appears well-developed and well-nourished. No distress.  HENT:  Head: Normocephalic and atraumatic.  Mouth/Throat: Oropharynx is clear and moist.  Eyes: EOM are normal. Pupils are equal, round, and reactive to light. Left eye exhibits discharge (clear). Left eye exhibits no chemosis and no exudate. No foreign body present in  the left eye. Left conjunctiva is injected.  Eye pressure- OD 23.95, OS 20.95.  Neck: Normal range of motion. Neck supple.  No meningeal signs.  Cardiovascular: Normal rate, regular rhythm and normal heart sounds.   +1 pitting edema LE BL.  Pulmonary/Chest: Effort normal and breath sounds normal.  Abdominal: Soft. Bowel sounds are normal. There is no tenderness.  Musculoskeletal: Normal range of motion. She exhibits no edema.  Neurological: She is alert and oriented to person, place, and time. She has normal strength. No cranial nerve deficit or sensory deficit. Coordination and gait normal.  Speech fluent, goal oriented. No facial asymmetry. Moves extremities without ataxia.  Skin: Skin is warm and dry. She is not diaphoretic.  Psychiatric: She has a normal mood and affect. Her behavior is normal.    ED Course  Procedures (including critical care time) Labs Review Labs Reviewed - No data to display  Imaging Review No results found. I, Lucien Mons, personally reviewed and evaluated these images and lab results as part of my medical decision-making.   EKG Interpretation None      MDM   Final diagnoses:  Migraine without aura and without status migrainosus, not intractable  Conjunctivitis, left eye   Nontoxic appearing, no apparent distress. Vital signs stable. No red flags concerning patient's headache. Similar to her prior migraines, no meningeal signs concerning for meningitis, no focal neuro deficits. Doubt CVA, ICH, SAH. On chart review, the patient frequently gets Dilaudid and Phenergan for migraines since she cannot take any form of NSAIDs due to history of colon cancer. Patient was given 1 mg Dilaudid and 25 mg Phenergan which brought her headache from a 10/10-7/10. When going into check on the patient, she was watching the TV on high volume despite reporting photophobia and phonophobia. She is resting comfortably. Stating she ran out of the percocet 5 she was given by  neurologist at [facility]. I told the patient that she would need to follow-up with the neurologist to receive any further Percocet for her migraine control. Stable for discharge. Return precautions given. Patient states understanding of treatment care plan and is agreeable.  Carman Ching, PA-C 10/18/14 [place], MD 10/19/14 [phone]

## 2014-10-18 NOTE — ED Notes (Signed)
MD at bedside. 

## 2014-10-18 NOTE — ED Notes (Signed)
Pt reports headache since Friday +nausea; also right eye red with drainage this morning- also reports lower extremity swelling x 3 days

## 2014-10-18 NOTE — Discharge Instructions (Signed)
Follow-up with your neurologist regarding control of your migraine headaches. Follow-up with your eye doctor regarding the left eye redness and drainage. Follow-up with your primary care physician as soon as possible. Return with any worsening symptoms.  Migraine Headache A migraine headache is an intense, throbbing pain on one or both sides of your head. A migraine can last for 30 minutes to several hours. CAUSES  The exact cause of a migraine headache is not always known. However, a migraine may be caused when nerves in the brain become irritated and release chemicals that cause inflammation. This causes pain. Certain things may also trigger migraines, such as:  Alcohol.  Smoking.  Stress.  Menstruation.  Aged cheeses.  Foods or drinks that contain nitrates, glutamate, aspartame, or tyramine.  Lack of sleep.  Chocolate.  Caffeine.  Hunger.  Physical exertion.  Fatigue.  Medicines used to treat chest pain (nitroglycerine), birth control pills, estrogen, and some blood pressure medicines. SIGNS AND SYMPTOMS  Pain on one or both sides of your head.  Pulsating or throbbing pain.  Severe pain that prevents daily activities.  Pain that is aggravated by any physical activity.  Nausea, vomiting, or both.  Dizziness.  Pain with exposure to bright lights, loud noises, or activity.  General sensitivity to bright lights, loud noises, or smells. Before you get a migraine, you may get warning signs that a migraine is coming (aura). An aura may include:  Seeing flashing lights.  Seeing bright spots, halos, or zigzag lines.  Having tunnel vision or blurred vision.  Having feelings of numbness or tingling.  Having trouble talking.  Having muscle weakness. DIAGNOSIS  A migraine headache is often diagnosed based on:  Symptoms.  Physical exam.  A CT scan or MRI of your head. These imaging tests cannot diagnose migraines, but they can help rule out other causes of  headaches. TREATMENT Medicines may be given for pain and nausea. Medicines can also be given to help prevent recurrent migraines.  HOME CARE INSTRUCTIONS  Only take over-the-counter or prescription medicines for pain or discomfort as directed by your health care provider. The use of long-term narcotics is not recommended.  Lie down in a dark, quiet room when you have a migraine.  Keep a journal to find out what may trigger your migraine headaches. For example, write down:  What you eat and drink.  How much sleep you get.  Any change to your diet or medicines.  Limit alcohol consumption.  Quit smoking if you smoke.  Get 7-9 hours of sleep, or as recommended by your health care provider.  Limit stress.  Keep lights dim if bright lights bother you and make your migraines worse. SEEK IMMEDIATE MEDICAL CARE IF:   Your migraine becomes severe.  You have a fever.  You have a stiff neck.  You have vision loss.  You have muscular weakness or loss of muscle control.  You start losing your balance or have trouble walking.  You feel faint or pass out.  You have severe symptoms that are different from your first symptoms. MAKE SURE YOU:   Understand these instructions.  Will watch your condition.  Will get help right away if you are not doing well or get worse. Document Released: 02/21/2005 Document Revised: 07/08/2013 Document Reviewed: 10/29/2012 [facility] Patient Information 2015 [place], [place]. This information is not intended to replace advice given to you by your health care provider. Make sure you discuss any questions you have with your health care provider.  Conjunctivitis  Conjunctivitis is commonly called "pink eye." Conjunctivitis can be caused by bacterial or viral infection, allergies, or injuries. There is usually redness of the lining of the eye, itching, discomfort, and sometimes discharge. There may be deposits of matter along the eyelids. A viral infection  usually causes a watery discharge, while a bacterial infection causes a yellowish, thick discharge. Pink eye is very contagious and spreads by direct contact. You may be given antibiotic eyedrops as part of your treatment. Before using your eye medicine, remove all drainage from the eye by washing gently with warm water and cotton balls. Continue to use the medication until you have awakened 2 mornings in a row without discharge from the eye. Do not rub your eye. This increases the irritation and helps spread infection. Use separate towels from other household members. Wash your hands with soap and water before and after touching your eyes. Use cold compresses to reduce pain and sunglasses to relieve irritation from light. Do not wear contact lenses or wear eye makeup until the infection is gone. SEEK MEDICAL CARE IF:   Your symptoms are not better after 3 days of treatment.  You have increased pain or trouble seeing.  The outer eyelids become very red or swollen. Document Released: 03/31/2004 Document Revised: 05/16/2011 Document Reviewed: 02/21/2005 [facility] Patient Information 2015 [place], [place]. This information is not intended to replace advice given to you by your health care provider. Make sure you discuss any questions you have with your health care provider.

## 2014-12-02 ENCOUNTER — Encounter (HOSPITAL_BASED_OUTPATIENT_CLINIC_OR_DEPARTMENT_OTHER): Payer: Self-pay | Admitting: Adult Health

## 2014-12-02 ENCOUNTER — Emergency Department (HOSPITAL_BASED_OUTPATIENT_CLINIC_OR_DEPARTMENT_OTHER)
Admission: EM | Admit: 2014-12-02 | Discharge: 2014-12-03 | Disposition: A | Discharge status: Home / Self Care | Payer: Medicaid Other | Attending: Emergency Medicine | Admitting: Emergency Medicine

## 2014-12-02 DIAGNOSIS — Z859 Personal history of malignant neoplasm, unspecified: Secondary | ICD-10-CM | POA: Diagnosis not present

## 2014-12-02 DIAGNOSIS — I10 Essential (primary) hypertension: Secondary | ICD-10-CM | POA: Insufficient documentation

## 2014-12-02 DIAGNOSIS — G43809 Other migraine, not intractable, without status migrainosus: Secondary | ICD-10-CM | POA: Diagnosis not present

## 2014-12-02 DIAGNOSIS — D649 Anemia, unspecified: Secondary | ICD-10-CM | POA: Diagnosis not present

## 2014-12-02 DIAGNOSIS — Z85038 Personal history of other malignant neoplasm of large intestine: Secondary | ICD-10-CM | POA: Diagnosis not present

## 2014-12-02 DIAGNOSIS — R197 Diarrhea, unspecified: Secondary | ICD-10-CM | POA: Diagnosis not present

## 2014-12-02 DIAGNOSIS — K219 Gastro-esophageal reflux disease without esophagitis: Secondary | ICD-10-CM | POA: Diagnosis not present

## 2014-12-02 DIAGNOSIS — Z79899 Other long term (current) drug therapy: Secondary | ICD-10-CM | POA: Insufficient documentation

## 2014-12-02 DIAGNOSIS — E663 Overweight: Secondary | ICD-10-CM | POA: Insufficient documentation

## 2014-12-02 MED ORDER — DIPHENHYDRAMINE HCL 50 MG/ML IJ SOLN
25.0000 mg | Freq: Once | INTRAMUSCULAR | Status: AC
  Administered 2014-12-02: 25 mg via INTRAVENOUS
  Filled 2014-12-02: qty 1

## 2014-12-02 MED ORDER — SODIUM CHLORIDE 0.9 % IV BOLUS (SEPSIS)
1000.0000 mL | Freq: Once | INTRAVENOUS | Status: AC
  Administered 2014-12-03: 1000 mL via INTRAVENOUS

## 2014-12-02 MED ORDER — PROCHLORPERAZINE EDISYLATE 5 MG/ML IJ SOLN
10.0000 mg | Freq: Four times a day (QID) | INTRAMUSCULAR | Status: DC | PRN
  Administered 2014-12-02: 10 mg via INTRAVENOUS
  Filled 2014-12-02: qty 2

## 2014-12-02 NOTE — ED Notes (Addendum)
Presents with headache that is ongoing for a "while" assocaited with nausea, and diarrhea that occurs at night and "It goes all over me and I have to change the bed. I think my kidney function is too high because of my blood pressure"  Endorses blood in stool and watery loose stool. Became agitated when asked how many loose stools she is having a day. She states she has been sick to stomach and unable to eat or drink but no vomiting.

## 2014-12-02 NOTE — ED Provider Notes (Addendum)
CSN: [id number]     Arrival date & time 12/02/14  2250 History  By signing my name below, I, Meriel Pica, attest that this documentation has been prepared under the direction and in the presence of Merryl Hacker, MD. Electronically Signed: Meriel Pica, ED Scribe. 12/02/2014. 1:56 AM.   Chief Complaint  Patient presents with  . Diarrhea   The history is provided by the patient. No language interpreter was used.   HPI Comments: Victoria Sullivan is a 59 y.o. female, with a PMhx of colon cancer, HTN, GERD, and migraines, who presents to the Emergency Department complaining of a constant, 10/10 headache that has been present for 3 days. Headache is frontal. Denies neck pain or stiffness. Similar pain in the past. Denies vision changes or photophobia. She reports associated mild, crampy abdominal pain, nausea, loose stools, and elevated BP. She takes Fiorcet at home with no relief of headache. BP is 183/153mmHg on triage vitals. Denies fevers, sick contacts, recent travel, recent antibiotic use, EtOH or drug use.  Headache is consistent with prior migraines.  Patient states that she has had 2 nights of recurrent diarrhea and she's been incontinent of stool.  Past Medical History  Diagnosis Date  . Cancer   . Hypertension   . Anemia   . GERD (gastroesophageal reflux disease)   . Tension headache   . Colon cancer   . Migraine    Past Surgical History  Procedure Laterality Date  . Cholecystectomy    . Abdominal hysterectomy    . Colon surgery     History reviewed. No pertinent family history. Social History  Substance Use Topics  . Smoking status: Never Smoker   . Smokeless tobacco: Never Used  . Alcohol Use: No   OB History    No data available     Review of Systems  Constitutional: Negative for fever.  Respiratory: Negative for cough, chest tightness and shortness of breath.   Cardiovascular: Negative for chest pain.  Gastrointestinal: Positive for nausea and diarrhea.  Negative for vomiting and abdominal pain.  Genitourinary: Negative for dysuria.  Musculoskeletal: Negative for back pain.  Neurological: Positive for headaches. Negative for weakness and numbness.  Psychiatric/Behavioral: Negative for confusion.  All other systems reviewed and are negative.  Allergies  Nsaids  Home Medications   Prior to Admission medications   Medication Sig Start Date End Date Taking? Authorizing Provider  AmLODIPine Besylate (NORVASC PO) Take by mouth.    Historical Provider, MD  captopril (CAPOTEN) 25 MG tablet Take 25 mg by mouth 3 (three) times daily.    Historical Provider, MD  cloNIDine (CATAPRES) 0.2 MG tablet Take 0.2 mg by mouth 2 (two) times daily.    Historical Provider, MD  esomeprazole (NEXIUM) 40 MG capsule Take 40 mg by mouth daily before breakfast.      Historical Provider, MD  ferrous sulfate 325 (65 FE) MG tablet Take 325 mg by mouth 3 (three) times daily.     Historical Provider, MD  furosemide (LASIX) 20 MG tablet Take 20 mg by mouth.    Historical Provider, MD  loperamide (IMODIUM) 2 MG capsule Take 1 capsule (2 mg total) by mouth 4 (four) times daily as needed for diarrhea or loose stools. 12/03/14   Merryl Hacker, MD  metoprolol (LOPRESSOR) 50 MG tablet Take 50 mg by mouth 2 (two) times daily.    Historical Provider, MD  ondansetron (ZOFRAN ODT) 4 MG disintegrating tablet Take 1 tablet (4 mg total) by mouth every  8 (eight) hours as needed for nausea or vomiting. 12/03/14   Merryl Hacker, MD  pantoprazole (PROTONIX) 20 MG tablet Take 20 mg by mouth daily.    Historical Provider, MD  pantoprazole (PROTONIX) 40 MG tablet Take 40 mg by mouth daily.    Historical Provider, MD  promethazine (PHENERGAN) 25 MG tablet Take 1 tablet (25 mg total) by mouth every 6 (six) hours as needed for nausea or vomiting. 09/03/14   Kristen N Ward, DO  sucralfate (CARAFATE) 1 G tablet Take 1 tablet (1 g total) by mouth 4 (four) times daily -  with meals and at  bedtime. 11/02/13   Dorie Rank, MD  traMADol (ULTRAM) 50 MG tablet Take 1 tablet (50 mg total) by mouth every 6 (six) hours as needed. 09/08/14   Orpah Greek, MD   BP 183/100 mmHg  Pulse 90  Temp(Src) 97.8 F (36.6 C) (Oral)  Resp 18  Wt 220 lb (99.791 kg)  SpO2 100% Physical Exam  Constitutional: She is oriented to person, place, and time. No distress.  Overweight  HENT:  Head: Normocephalic and atraumatic.  Eyes: EOM are normal. Pupils are equal, round, and reactive to light.  Cardiovascular: Normal rate, regular rhythm and normal heart sounds.   No murmur heard. Pulmonary/Chest: Effort normal and breath sounds normal. No respiratory distress. She has no wheezes.  Abdominal: Soft. Bowel sounds are normal. There is no tenderness. There is no rebound.  Neurological: She is alert and oriented to person, place, and time.  Cranial nerves II through XII intact, 5 out of 5 strength in all 4 extremities  Skin: Skin is warm and dry.  Psychiatric: She has a normal mood and affect.  Nursing note and vitals reviewed.   ED Course  Procedures  DIAGNOSTIC STUDIES: Oxygen Saturation is 100% on RA, normal by my interpretation.    COORDINATION OF CARE: 11:21 PM Discussed treatment plan with pt at bedside and pt agreed to plan.   Labs Review Labs Reviewed  CBC WITH DIFFERENTIAL/PLATELET - Abnormal; Notable for the following:    Hemoglobin 11.9 (*)    All other components within normal limits  COMPREHENSIVE METABOLIC PANEL - Abnormal; Notable for the following:    BUN 27 (*)    Calcium 8.4 (*)    All other components within normal limits  URINALYSIS, ROUTINE W REFLEX MICROSCOPIC (NOT AT [facility]) - Abnormal; Notable for the following:    APPearance CLOUDY (*)    Leukocytes, UA SMALL (*)    All other components within normal limits  URINE MICROSCOPIC-ADD ON - Abnormal; Notable for the following:    Squamous Epithelial / LPF FEW (*)    Bacteria, UA MANY (*)    All other components  within normal limits    Imaging Review No results found. I have personally reviewed and evaluated these images and lab results as part of my medical decision-making.   EKG Interpretation None      MDM   Final diagnoses:  Other migraine without status migrainosus, not intractable  Diarrhea    Patient presents with headache and diarrhea. Nontoxic on exam. Initial vital signs notable for blood pressure of 180/91. Patient does have a history of migraines.  No fever or neck stiffness. Low suspicion for meningitis or subarachnoid hemorrhage. She was given fluids, Compazine, and Benadryl. She also reports diarrhea with associated nausea and mild abdominal code. Abdominal exam is benign. No risk factors for C. difficile. Lab work obtained and largely reassuring. No significant  dehydration. Hemoglobin is 11.9, down from a baseline of 13-14. Denies any dark or bloody stools. Patient reports some improvement of her headache. Repeat blood pressure 140/80. Doubt hypertensive urgency or emergency. She is been able to tolerate fluids. Will discharge home with Imodium and Zofran.  After history, exam, and medical workup I feel the patient has been appropriately medically screened and is safe for discharge home. Pertinent diagnoses were discussed with the patient. Patient was given return precautions.  I personally performed the services described in this documentation, which was scribed in my presence. The recorded information has been reviewed and is accurate.    Merryl Hacker, MD 12/03/14 [id number]  Merryl Hacker, MD 12/03/14 0200

## 2014-12-03 LAB — URINALYSIS, ROUTINE W REFLEX MICROSCOPIC
BILIRUBIN URINE: NEGATIVE
Glucose, UA: NEGATIVE mg/dL
Hgb urine dipstick: NEGATIVE
KETONES UR: NEGATIVE mg/dL
NITRITE: NEGATIVE
PH: 6 (ref 5.0–8.0)
PROTEIN: NEGATIVE mg/dL
Specific Gravity, Urine: 1.03 (ref 1.005–1.030)
UROBILINOGEN UA: 0.2 mg/dL (ref 0.0–1.0)

## 2014-12-03 LAB — COMPREHENSIVE METABOLIC PANEL
ALT: 20 U/L (ref 14–54)
AST: 19 U/L (ref 15–41)
Albumin: 3.7 g/dL (ref 3.5–5.0)
Alkaline Phosphatase: 107 U/L (ref 38–126)
Anion gap: 7 (ref 5–15)
BUN: 27 mg/dL — ABNORMAL HIGH (ref 6–20)
CHLORIDE: 110 mmol/L (ref 101–111)
CO2: 22 mmol/L (ref 22–32)
Calcium: 8.4 mg/dL — ABNORMAL LOW (ref 8.9–10.3)
Creatinine, Ser: 0.87 mg/dL (ref 0.44–1.00)
GFR calc non Af Amer: >60 mL/min (ref >60)
Glucose, Bld: 95 mg/dL (ref 65–99)
Potassium: 3.5 mmol/L (ref 3.5–5.1)
SODIUM: 139 mmol/L (ref 135–145)
Total Bilirubin: 0.3 mg/dL (ref 0.3–1.2)
Total Protein: 6.9 g/dL (ref 6.5–8.1)

## 2014-12-03 LAB — CBC WITH DIFFERENTIAL/PLATELET
BASOS ABS: 0 10*3/uL (ref 0.0–0.1)
Basophils Relative: 0 %
EOS ABS: 0.1 10*3/uL (ref 0.0–0.7)
EOS PCT: 2 %
HCT: 37.9 % (ref 36.0–46.0)
Hemoglobin: 11.9 g/dL — ABNORMAL LOW (ref 12.0–15.0)
Lymphocytes Relative: 34 %
Lymphs Abs: 1.6 10*3/uL (ref 0.7–4.0)
MCH: 26.2 pg (ref 26.0–34.0)
MCHC: 31.4 g/dL (ref 30.0–36.0)
MCV: 83.5 fL (ref 78.0–100.0)
Monocytes Absolute: 0.5 10*3/uL (ref 0.1–1.0)
Monocytes Relative: 11 %
Neutro Abs: 2.4 10*3/uL (ref 1.7–7.7)
Neutrophils Relative %: 53 %
PLATELETS: 222 10*3/uL (ref 150–400)
RBC: 4.54 MIL/uL (ref 3.87–5.11)
RDW: 15.3 % (ref 11.5–15.5)
WBC: 4.6 10*3/uL (ref 4.0–10.5)

## 2014-12-03 LAB — URINE MICROSCOPIC-ADD ON

## 2014-12-03 MED ORDER — ONDANSETRON 4 MG PO TBDP: 4.0000 mg | ORAL_TABLET | Freq: Three times a day (TID) | ORAL | Status: DC | PRN

## 2014-12-03 MED ORDER — LOPERAMIDE HCL 2 MG PO CAPS: 2.0000 mg | ORAL_CAPSULE | Freq: Four times a day (QID) | ORAL | Status: AC | PRN

## 2014-12-03 NOTE — ED Notes (Signed)
Pt. Reports she is tired and sleepy because she has not slept for the last few nights.  Pt. Asking for pain meds and reports to RN that compazine never works for her.  Pt. IV site is intact with NS infusing.

## 2014-12-03 NOTE — Discharge Instructions (Signed)
You were seen today for several complaints including headache and diarrhea. You were treated for migraine headache which she had a history of. Your exam is otherwise reassuring. Regarding her diarrhea, your lab work is reassuring and he had no evidence of significant dehydration. You should aggressively hydrate at home and take Lomotil as needed for diarrhea. This could be related to a viral illness. If he has any new or worsening symptoms including fever, abdominal pain or inability to tolerate fluids, you should be reevaluated.    Diarrhea Diarrhea is frequent loose and watery bowel movements. It can cause you to feel weak and dehydrated. Dehydration can cause you to become tired and thirsty, have a dry mouth, and have decreased urination that often is dark yellow. Diarrhea is a sign of another problem, most often an infection that will not last long. In most cases, diarrhea typically lasts 2-3 days. However, it can last longer if it is a sign of something more serious. It is important to treat your diarrhea as directed by your caregiver to lessen or prevent future episodes of diarrhea. CAUSES  Some common causes include:  Gastrointestinal infections caused by viruses, bacteria, or parasites.  Food poisoning or food allergies.  Certain medicines, such as antibiotics, chemotherapy, and laxatives.  Artificial sweeteners and fructose.  Digestive disorders. HOME CARE INSTRUCTIONS  Ensure adequate fluid intake (hydration): Have 1 cup (8 oz) of fluid for each diarrhea episode. Avoid fluids that contain simple sugars or sports drinks, fruit juices, whole milk products, and sodas. Your urine should be clear or pale yellow if you are drinking enough fluids. Hydrate with an oral rehydration solution that you can purchase at pharmacies, retail stores, and online. You can prepare an oral rehydration solution at home by mixing the following ingredients together:   - tsp table salt.   tsp baking soda.    tsp salt substitute containing potassium chloride.  1  tablespoons sugar.  1 L (34 oz) of water.  Certain foods and beverages may increase the speed at which food moves through the gastrointestinal (GI) tract. These foods and beverages should be avoided and include:  Caffeinated and alcoholic beverages.  High-fiber foods, such as raw fruits and vegetables, nuts, seeds, and whole grain breads and cereals.  Foods and beverages sweetened with sugar alcohols, such as xylitol, sorbitol, and mannitol.  Some foods may be well tolerated and may help thicken stool including:  Starchy foods, such as rice, toast, pasta, low-sugar cereal, oatmeal, grits, baked potatoes, crackers, and bagels.  Bananas.  Applesauce.  Add probiotic-rich foods to help increase healthy bacteria in the GI tract, such as yogurt and fermented milk products.  Wash your hands well after each diarrhea episode.  Only take over-the-counter or prescription medicines as directed by your caregiver.  Take a warm bath to relieve any burning or pain from frequent diarrhea episodes. SEEK IMMEDIATE MEDICAL CARE IF:   You are unable to keep fluids down.  You have persistent vomiting.  You have blood in your stool, or your stools are black and tarry.  You do not urinate in 6-8 hours, or there is only a small amount of very dark urine.  You have abdominal pain that increases or localizes.  You have weakness, dizziness, confusion, or light-headedness.  You have a severe headache.  Your diarrhea gets worse or does not get better.  You have a fever or persistent symptoms for more than 2-3 days.  You have a fever and your symptoms suddenly get worse.  MAKE SURE YOU:   Understand these instructions.  Will watch your condition.  Will get help right away if you are not doing well or get worse. Document Released: 02/11/2002 Document Revised: 07/08/2013 Document Reviewed: 10/30/2011 [facility] Patient Information 2015  [place], [place]. This information is not intended to replace advice given to you by your health care provider. Make sure you discuss any questions you have with your health care provider. Migraine Headache A migraine headache is an intense, throbbing pain on one or both sides of your head. A migraine can last for 30 minutes to several hours. CAUSES  The exact cause of a migraine headache is not always known. However, a migraine may be caused when nerves in the brain become irritated and release chemicals that cause inflammation. This causes pain. Certain things may also trigger migraines, such as:  Alcohol.  Smoking.  Stress.  Menstruation.  Aged cheeses.  Foods or drinks that contain nitrates, glutamate, aspartame, or tyramine.  Lack of sleep.  Chocolate.  Caffeine.  Hunger.  Physical exertion.  Fatigue.  Medicines used to treat chest pain (nitroglycerine), birth control pills, estrogen, and some blood pressure medicines. SIGNS AND SYMPTOMS  Pain on one or both sides of your head.  Pulsating or throbbing pain.  Severe pain that prevents daily activities.  Pain that is aggravated by any physical activity.  Nausea, vomiting, or both.  Dizziness.  Pain with exposure to bright lights, loud noises, or activity.  General sensitivity to bright lights, loud noises, or smells. Before you get a migraine, you may get warning signs that a migraine is coming (aura). An aura may include:  Seeing flashing lights.  Seeing bright spots, halos, or zigzag lines.  Having tunnel vision or blurred vision.  Having feelings of numbness or tingling.  Having trouble talking.  Having muscle weakness. DIAGNOSIS  A migraine headache is often diagnosed based on:  Symptoms.  Physical exam.  A CT scan or MRI of your head. These imaging tests cannot diagnose migraines, but they can help rule out other causes of headaches. TREATMENT Medicines may be given for pain and nausea.  Medicines can also be given to help prevent recurrent migraines.  HOME CARE INSTRUCTIONS  Only take over-the-counter or prescription medicines for pain or discomfort as directed by your health care provider. The use of long-term narcotics is not recommended.  Lie down in a dark, quiet room when you have a migraine.  Keep a journal to find out what may trigger your migraine headaches. For example, write down:  What you eat and drink.  How much sleep you get.  Any change to your diet or medicines.  Limit alcohol consumption.  Quit smoking if you smoke.  Get 7-9 hours of sleep, or as recommended by your health care provider.  Limit stress.  Keep lights dim if bright lights bother you and make your migraines worse. SEEK IMMEDIATE MEDICAL CARE IF:   Your migraine becomes severe.  You have a fever.  You have a stiff neck.  You have vision loss.  You have muscular weakness or loss of muscle control.  You start losing your balance or have trouble walking.  You feel faint or pass out.  You have severe symptoms that are different from your first symptoms. MAKE SURE YOU:   Understand these instructions.  Will watch your condition.  Will get help right away if you are not doing well or get worse. Document Released: 02/21/2005 Document Revised: 07/08/2013 Document Reviewed: 10/29/2012 [employer] Patient  Information 2015 [place], [place]. This information is not intended to replace advice given to you by your health care provider. Make sure you discuss any questions you have with your health care provider.

## 2014-12-03 NOTE — ED Notes (Signed)
Tolerating ginger ale at this time 

## 2015-05-04 ENCOUNTER — Emergency Department (HOSPITAL_BASED_OUTPATIENT_CLINIC_OR_DEPARTMENT_OTHER)
Admission: EM | Admit: 2015-05-04 | Discharge: 2015-05-04 | Disposition: A | Discharge status: Home / Self Care | Payer: Medicaid Other | Attending: Emergency Medicine | Admitting: Emergency Medicine

## 2015-05-04 ENCOUNTER — Encounter (HOSPITAL_BASED_OUTPATIENT_CLINIC_OR_DEPARTMENT_OTHER): Payer: Self-pay | Admitting: *Deleted

## 2015-05-04 DIAGNOSIS — Z85038 Personal history of other malignant neoplasm of large intestine: Secondary | ICD-10-CM | POA: Diagnosis not present

## 2015-05-04 DIAGNOSIS — R51 Headache: Secondary | ICD-10-CM | POA: Diagnosis present

## 2015-05-04 DIAGNOSIS — Z79899 Other long term (current) drug therapy: Secondary | ICD-10-CM | POA: Diagnosis not present

## 2015-05-04 DIAGNOSIS — G43909 Migraine, unspecified, not intractable, without status migrainosus: Secondary | ICD-10-CM | POA: Insufficient documentation

## 2015-05-04 DIAGNOSIS — I10 Essential (primary) hypertension: Secondary | ICD-10-CM | POA: Diagnosis not present

## 2015-05-04 DIAGNOSIS — K219 Gastro-esophageal reflux disease without esophagitis: Secondary | ICD-10-CM | POA: Insufficient documentation

## 2015-05-04 DIAGNOSIS — D649 Anemia, unspecified: Secondary | ICD-10-CM | POA: Diagnosis not present

## 2015-05-04 DIAGNOSIS — R519 Headache, unspecified: Secondary | ICD-10-CM

## 2015-05-04 MED ORDER — ONDANSETRON 8 MG PO TBDP: 8.0000 mg | ORAL_TABLET | Freq: Three times a day (TID) | ORAL | Status: AC | PRN

## 2015-05-04 MED ORDER — METOCLOPRAMIDE HCL 5 MG/ML IJ SOLN
10.0000 mg | Freq: Once | INTRAMUSCULAR | Status: AC
  Administered 2015-05-04: 10 mg via INTRAMUSCULAR
  Filled 2015-05-04: qty 2

## 2015-05-04 MED ORDER — ONDANSETRON 8 MG PO TBDP
8.0000 mg | ORAL_TABLET | Freq: Once | ORAL | Status: AC
  Administered 2015-05-04: 8 mg via ORAL
  Filled 2015-05-04: qty 1

## 2015-05-04 MED ORDER — KETOROLAC TROMETHAMINE 60 MG/2ML IM SOLN
60.0000 mg | Freq: Once | INTRAMUSCULAR | Status: AC
  Administered 2015-05-04: 60 mg via INTRAMUSCULAR
  Filled 2015-05-04: qty 2

## 2015-05-04 MED ORDER — ACETAMINOPHEN 325 MG PO TABS
650.0000 mg | ORAL_TABLET | Freq: Once | ORAL | Status: AC
  Administered 2015-05-04: 650 mg via ORAL
  Filled 2015-05-04: qty 2

## 2015-05-04 MED ORDER — HYDROMORPHONE HCL 1 MG/ML IJ SOLN
1.0000 mg | Freq: Once | INTRAMUSCULAR | Status: AC
  Administered 2015-05-04: 1 mg via INTRAMUSCULAR
  Filled 2015-05-04: qty 1

## 2015-05-04 NOTE — ED Provider Notes (Signed)
CSN: [id number]     Arrival date & time 05/04/15  [id number] History   First MD Initiated Contact with Patient 05/04/15 1039     Chief Complaint  Patient presents with  . Headache     HPI Patient reports headaches with past 2 weeks.  She has a history of migraine headaches and states this feels similar.  She does have some photophobia without phonophobia.  She has tried an old Percocet that she had at home without improvement in her symptoms.  She denies weakness of her arms or legs.  No chest pain shortness of breath.  Denies fevers and chills and neck pain.  No abdominal pain.  She states this feels like her prior headaches.  She reports she usually requires Dilaudid and Phenergan to make her headache better.   Past Medical History  Diagnosis Date  . Cancer ([place])   . Hypertension   . Anemia   . GERD (gastroesophageal reflux disease)   . Tension headache   . Colon cancer ([place])   . Migraine    Past Surgical History  Procedure Laterality Date  . Cholecystectomy    . Abdominal hysterectomy    . Colon surgery     No family history on file. Social History  Substance Use Topics  . Smoking status: Never Smoker   . Smokeless tobacco: Never Used  . Alcohol Use: No   OB History    No data available     Review of Systems  All other systems reviewed and are negative.     Allergies  Nsaids  Home Medications   Prior to Admission medications   Medication Sig Start Date End Date Taking? Authorizing Provider  promethazine (PHENERGAN) 25 MG tablet Take 1 tablet (25 mg total) by mouth every 6 (six) hours as needed for nausea or vomiting. 09/03/14  Yes Kristen N Ward, DO  AmLODIPine Besylate (NORVASC PO) Take by mouth.    Historical Provider, MD  captopril (CAPOTEN) 25 MG tablet Take 25 mg by mouth 3 (three) times daily.    Historical Provider, MD  cloNIDine (CATAPRES) 0.2 MG tablet Take 0.2 mg by mouth 2 (two) times daily.    Historical Provider, MD  esomeprazole (NEXIUM) 40 MG capsule  Take 40 mg by mouth daily before breakfast.      Historical Provider, MD  ferrous sulfate 325 (65 FE) MG tablet Take 325 mg by mouth 3 (three) times daily.     Historical Provider, MD  furosemide (LASIX) 20 MG tablet Take 20 mg by mouth.    Historical Provider, MD  loperamide (IMODIUM) 2 MG capsule Take 1 capsule (2 mg total) by mouth 4 (four) times daily as needed for diarrhea or loose stools. 12/03/14   Merryl Hacker, MD  metoprolol (LOPRESSOR) 50 MG tablet Take 50 mg by mouth 2 (two) times daily.    Historical Provider, MD  ondansetron (ZOFRAN ODT) 8 MG disintegrating tablet Take 1 tablet (8 mg total) by mouth every 8 (eight) hours as needed for nausea or vomiting. 05/04/15   Jola Schmidt, MD  pantoprazole (PROTONIX) 20 MG tablet Take 20 mg by mouth daily.    Historical Provider, MD  pantoprazole (PROTONIX) 40 MG tablet Take 40 mg by mouth daily.    Historical Provider, MD  sucralfate (CARAFATE) 1 G tablet Take 1 tablet (1 g total) by mouth 4 (four) times daily -  with meals and at bedtime. 11/02/13   Dorie Rank, MD  traMADol Veatrice Bourbon) 50 MG tablet Take  1 tablet (50 mg total) by mouth every 6 (six) hours as needed. 09/08/14   Orpah Greek, MD   BP 173/113 mmHg  Pulse 103  Temp(Src) 98.3 F (36.8 C) (Oral)  Resp 16  Ht 5\' 3"  (1.6 m)  Wt 216 lb (97.977 kg)  BMI 38.27 kg/m2  SpO2 98% Physical Exam  Constitutional: She is oriented to person, place, and time. She appears well-developed and well-nourished. No distress.  HENT:  Head: Normocephalic and atraumatic.  Eyes: EOM are normal. Pupils are equal, round, and reactive to light.  Neck: Normal range of motion.  Cardiovascular: Normal rate, regular rhythm and normal heart sounds.   Pulmonary/Chest: Effort normal and breath sounds normal.  Abdominal: Soft. She exhibits no distension. There is no tenderness.  Musculoskeletal: Normal range of motion.  Neurological: She is alert and oriented to person, place, and time.  5/5 strength in  major muscle groups of  bilateral upper and lower extremities. Speech normal. No facial asymetry.   Skin: Skin is warm and dry.  Psychiatric: She has a normal mood and affect. Judgment normal.  Nursing note and vitals reviewed.   ED Course  Procedures (including critical care time) Labs Review Labs Reviewed - No data to display  Imaging Review No results found. I have personally reviewed and evaluated these images and lab results as part of my medical decision-making.   EKG Interpretation None      MDM   Final diagnoses:  Headache, unspecified headache type    12:19 PM Improvement in headache.  No focal deficits at this time.  No indication for acute imaging.  I recommended that she follow-up with a neurologist given her persistent and recurrent headaches.  She states she would prefer to follow-up with her primary care physician.  This is reasonable.  Patient understands to return to the emergency department for new or worsening symptoms    Jola Schmidt, MD 05/04/15 1220

## 2015-05-04 NOTE — Discharge Instructions (Signed)

## 2015-05-04 NOTE — ED Notes (Signed)
Pt c/o HA that has been intermittent x2 weeks. This episode has been lasting 5 days.

## 2015-06-23 ENCOUNTER — Emergency Department (HOSPITAL_BASED_OUTPATIENT_CLINIC_OR_DEPARTMENT_OTHER)
Admission: EM | Admit: 2015-06-23 | Discharge: 2015-06-23 | Disposition: A | Discharge status: Home / Self Care | Payer: Medicare Other | Attending: Emergency Medicine | Admitting: Emergency Medicine

## 2015-06-23 ENCOUNTER — Encounter (HOSPITAL_BASED_OUTPATIENT_CLINIC_OR_DEPARTMENT_OTHER): Payer: Self-pay

## 2015-06-23 DIAGNOSIS — Z79899 Other long term (current) drug therapy: Secondary | ICD-10-CM | POA: Insufficient documentation

## 2015-06-23 DIAGNOSIS — R112 Nausea with vomiting, unspecified: Secondary | ICD-10-CM | POA: Diagnosis not present

## 2015-06-23 DIAGNOSIS — I10 Essential (primary) hypertension: Secondary | ICD-10-CM | POA: Insufficient documentation

## 2015-06-23 DIAGNOSIS — R51 Headache: Secondary | ICD-10-CM | POA: Insufficient documentation

## 2015-06-23 DIAGNOSIS — R519 Headache, unspecified: Secondary | ICD-10-CM

## 2015-06-23 DIAGNOSIS — Z85038 Personal history of other malignant neoplasm of large intestine: Secondary | ICD-10-CM | POA: Diagnosis not present

## 2015-06-23 MED ORDER — PROMETHAZINE HCL 25 MG RE SUPP: 25.0000 mg | Freq: Four times a day (QID) | RECTAL | Status: DC | PRN

## 2015-06-23 MED ORDER — HYDROMORPHONE HCL 1 MG/ML IJ SOLN
INTRAMUSCULAR | Status: AC
  Filled 2015-06-23: qty 2

## 2015-06-23 MED ORDER — PROMETHAZINE HCL 25 MG/ML IJ SOLN
INTRAMUSCULAR | Status: AC
  Administered 2015-06-23: 25 mg
  Filled 2015-06-23: qty 1

## 2015-06-23 MED ORDER — OXYCODONE-ACETAMINOPHEN 5-325 MG PO TABS: 2.0000 | ORAL_TABLET | ORAL | Status: AC | PRN

## 2015-06-23 MED ORDER — HYDROMORPHONE HCL 1 MG/ML IJ SOLN
2.0000 mg | Freq: Once | INTRAMUSCULAR | Status: AC
  Administered 2015-06-23: 2 mg via INTRAMUSCULAR

## 2015-06-23 MED ORDER — DEXAMETHASONE SODIUM PHOSPHATE 10 MG/ML IJ SOLN
10.0000 mg | Freq: Once | INTRAMUSCULAR | Status: AC
  Administered 2015-06-23: 10 mg via INTRAMUSCULAR
  Filled 2015-06-23: qty 1

## 2015-06-23 MED FILL — OXYCODONE/APAP 5-325: 5-325 | 1 days supply | Qty: 6 | Fill #0

## 2015-06-23 MED FILL — PHENADOZ 25 MG SUPP: 25 | 3 days supply | Qty: 10 | Fill #0

## 2015-06-23 NOTE — ED Provider Notes (Signed)
CSN: [id number]     Arrival date & time 06/23/15  [id number] History   First MD Initiated Contact with Patient 06/23/15 [phone]     Chief Complaint  Patient presents with  . Migraine     (Consider location/radiation/quality/duration/timing/severity/associated sxs/prior Treatment) HPI This is a 60 year old female with a history of migraines. She is here with a headache that began 3 days ago. The headache is located frontally and is described as throbbing and like previous migraines. She rates her pain as a 10. There is been associated photophobia, nausea and vomiting. She has not gotten relief with Fioricet or Phenergan tablets. She is having no focal neurologic deficits.  Past Medical History  Diagnosis Date  . Cancer ([place])   . Hypertension   . Anemia   . GERD (gastroesophageal reflux disease)   . Tension headache   . Colon cancer ([place])   . Migraine    Past Surgical History  Procedure Laterality Date  . Cholecystectomy    . Abdominal hysterectomy    . Colon surgery     No family history on file. Social History  Substance Use Topics  . Smoking status: Never Smoker   . Smokeless tobacco: Never Used  . Alcohol Use: No   OB History    No data available     Review of Systems  All other systems reviewed and are negative.   Allergies  Nsaids and Morphine and related  Home Medications   Prior to Admission medications   Medication Sig Start Date End Date Taking? Authorizing Provider  AmLODIPine Besylate (NORVASC PO) Take by mouth.    Historical Provider, MD  captopril (CAPOTEN) 25 MG tablet Take 25 mg by mouth 3 (three) times daily.    Historical Provider, MD  cloNIDine (CATAPRES) 0.2 MG tablet Take 0.2 mg by mouth 2 (two) times daily.    Historical Provider, MD  esomeprazole (NEXIUM) 40 MG capsule Take 40 mg by mouth daily before breakfast.      Historical Provider, MD  ferrous sulfate 325 (65 FE) MG tablet Take 325 mg by mouth 3 (three) times daily.     Historical Provider, MD   furosemide (LASIX) 20 MG tablet Take 20 mg by mouth.    Historical Provider, MD  loperamide (IMODIUM) 2 MG capsule Take 1 capsule (2 mg total) by mouth 4 (four) times daily as needed for diarrhea or loose stools. 12/03/14   Merryl Hacker, MD  metoprolol (LOPRESSOR) 50 MG tablet Take 50 mg by mouth 2 (two) times daily.    Historical Provider, MD  ondansetron (ZOFRAN ODT) 8 MG disintegrating tablet Take 1 tablet (8 mg total) by mouth every 8 (eight) hours as needed for nausea or vomiting. 05/04/15   Jola Schmidt, MD  pantoprazole (PROTONIX) 20 MG tablet Take 20 mg by mouth daily.    Historical Provider, MD  pantoprazole (PROTONIX) 40 MG tablet Take 40 mg by mouth daily.    Historical Provider, MD  promethazine (PHENERGAN) 25 MG tablet Take 1 tablet (25 mg total) by mouth every 6 (six) hours as needed for nausea or vomiting. 09/03/14   Kristen N Ward, DO  sucralfate (CARAFATE) 1 G tablet Take 1 tablet (1 g total) by mouth 4 (four) times daily -  with meals and at bedtime. 11/02/13   Dorie Rank, MD  traMADol (ULTRAM) 50 MG tablet Take 1 tablet (50 mg total) by mouth every 6 (six) hours as needed. 09/08/14   Orpah Greek, MD   BP 202/97  mmHg  Pulse 112  Temp(Src) 97.6 F (36.4 C) (Oral)  Resp 18  Ht 5\' 3"  (1.6 m)  Wt 215 lb (97.523 kg)  BMI 38.09 kg/m2  SpO2 99%   Physical Exam  General: Well-developed, well-nourished female in no acute distress; appearance consistent with age of record HENT: normocephalic; atraumatic Eyes: pupils equal, round and reactive to light; extraocular muscles intact Neck: supple Heart: regular rate and rhythm Lungs: clear to auscultation bilaterally Abdomen: soft; nondistended; nontender; bowel sounds present Extremities: No deformity; full range of motion; pulses normal Neurologic: Awake, alert and oriented; motor function intact in all extremities and symmetric; no facial droop Skin: Warm and dry Psychiatric: Normal mood and affect    ED Course   Procedures (including critical care time)   MDM  7:13 AM Pain not adequately controlled but patient would prefer to go home and sleep in her own bed. She is requesting prescriptions for Percocet and Phenergan suppositories.    Shanon Rosser, MD 06/23/15 [phone]

## 2015-06-23 NOTE — ED Notes (Signed)
Pt c/o migraine headaches with vomiting since Saturday

## 2015-07-13 ENCOUNTER — Emergency Department (HOSPITAL_BASED_OUTPATIENT_CLINIC_OR_DEPARTMENT_OTHER): Payer: Medicare Other

## 2015-07-13 ENCOUNTER — Emergency Department (HOSPITAL_BASED_OUTPATIENT_CLINIC_OR_DEPARTMENT_OTHER)
Admission: EM | Admit: 2015-07-13 | Discharge: 2015-07-13 | Disposition: A | Discharge status: Home / Self Care | Payer: Medicare Other | Attending: Emergency Medicine | Admitting: Emergency Medicine

## 2015-07-13 ENCOUNTER — Encounter (HOSPITAL_BASED_OUTPATIENT_CLINIC_OR_DEPARTMENT_OTHER): Payer: Self-pay | Admitting: *Deleted

## 2015-07-13 DIAGNOSIS — Z79899 Other long term (current) drug therapy: Secondary | ICD-10-CM | POA: Insufficient documentation

## 2015-07-13 DIAGNOSIS — R51 Headache: Secondary | ICD-10-CM | POA: Diagnosis present

## 2015-07-13 DIAGNOSIS — Z85038 Personal history of other malignant neoplasm of large intestine: Secondary | ICD-10-CM | POA: Diagnosis not present

## 2015-07-13 DIAGNOSIS — R519 Headache, unspecified: Secondary | ICD-10-CM

## 2015-07-13 DIAGNOSIS — I10 Essential (primary) hypertension: Secondary | ICD-10-CM | POA: Insufficient documentation

## 2015-07-13 MED ORDER — DEXAMETHASONE SODIUM PHOSPHATE 10 MG/ML IJ SOLN
10.0000 mg | Freq: Once | INTRAMUSCULAR | Status: AC
  Administered 2015-07-13: 10 mg via INTRAVENOUS
  Filled 2015-07-13: qty 1

## 2015-07-13 MED ORDER — PROCHLORPERAZINE EDISYLATE 5 MG/ML IJ SOLN
10.0000 mg | Freq: Once | INTRAMUSCULAR | Status: AC
  Administered 2015-07-13: 10 mg via INTRAVENOUS
  Filled 2015-07-13: qty 2

## 2015-07-13 MED ORDER — DIPHENHYDRAMINE HCL 50 MG/ML IJ SOLN
25.0000 mg | Freq: Once | INTRAMUSCULAR | Status: AC
  Administered 2015-07-13: 25 mg via INTRAVENOUS
  Filled 2015-07-13: qty 1

## 2015-07-13 MED ORDER — HYDROMORPHONE HCL 1 MG/ML IJ SOLN
0.5000 mg | Freq: Once | INTRAMUSCULAR | Status: AC
  Administered 2015-07-13: 0.5 mg via INTRAMUSCULAR
  Filled 2015-07-13: qty 1

## 2015-07-13 MED ORDER — SODIUM CHLORIDE 0.9 % IV BOLUS (SEPSIS)
1000.0000 mL | Freq: Once | INTRAVENOUS | Status: AC
  Administered 2015-07-13: 1000 mL via INTRAVENOUS

## 2015-07-13 MED ORDER — FENTANYL CITRATE (PF) 100 MCG/2ML IJ SOLN
50.0000 ug | Freq: Once | INTRAMUSCULAR | Status: AC
  Administered 2015-07-13: 50 ug via INTRAVENOUS
  Filled 2015-07-13: qty 2

## 2015-07-13 NOTE — ED Provider Notes (Signed)
CSN: [id number]     Arrival date & time 07/13/15  1340 History   First MD Initiated Contact with Patient 07/13/15 1359     Chief Complaint  Patient presents with  . Headache     (Consider location/radiation/quality/duration/timing/severity/associated sxs/prior Treatment) HPI Patient presents to the emergency department with headache.  This been ongoing for 3 days.  Patient, states she has had similar headaches in the past.  She states that she tried her Fioricet without relief of her symptoms.  She states that she was recently seen at another hospital, but did not get relief of her headache.  The patient states that about a month ago she had an MRI done and that showed a small area of possible infarct.  The patient states that this has no new characteristics.  She states that she is not having any neurological deficits at this time. The patient denies chest pain, shortness of breath,blurred vision, neck pain, fever, cough, weakness, numbness, dizziness, anorexia, edema, abdominal pain, nausea, vomiting, diarrhea, rash, back pain, dysuria, hematemesis, bloody stool, near syncope, or syncope.  Patient states nothing seems to make the condition better or worse Past Medical History  Diagnosis Date  . Cancer ([place])   . Hypertension   . Anemia   . GERD (gastroesophageal reflux disease)   . Tension headache   . Colon cancer ([place])   . Migraine    Past Surgical History  Procedure Laterality Date  . Cholecystectomy    . Abdominal hysterectomy    . Colon surgery     No family history on file. Social History  Substance Use Topics  . Smoking status: Never Smoker   . Smokeless tobacco: Never Used  . Alcohol Use: No   OB History    No data available     Review of Systems  All other systems negative except as documented in the HPI. All pertinent positives and negatives as reviewed in the HPI.   Allergies  Nsaids and Morphine and related  Home Medications   Prior to Admission  medications   Medication Sig Start Date End Date Taking? Authorizing Provider  AmLODIPine Besylate (NORVASC PO) Take by mouth.    Historical Provider, MD  captopril (CAPOTEN) 25 MG tablet Take 25 mg by mouth 3 (three) times daily.    Historical Provider, MD  cloNIDine (CATAPRES) 0.2 MG tablet Take 0.2 mg by mouth 2 (two) times daily.    Historical Provider, MD  esomeprazole (NEXIUM) 40 MG capsule Take 40 mg by mouth daily before breakfast.      Historical Provider, MD  ferrous sulfate 325 (65 FE) MG tablet Take 325 mg by mouth 3 (three) times daily.     Historical Provider, MD  furosemide (LASIX) 20 MG tablet Take 20 mg by mouth.    Historical Provider, MD  loperamide (IMODIUM) 2 MG capsule Take 1 capsule (2 mg total) by mouth 4 (four) times daily as needed for diarrhea or loose stools. 12/03/14   Merryl Hacker, MD  metoprolol (LOPRESSOR) 50 MG tablet Take 50 mg by mouth 2 (two) times daily.    Historical Provider, MD  ondansetron (ZOFRAN ODT) 8 MG disintegrating tablet Take 1 tablet (8 mg total) by mouth every 8 (eight) hours as needed for nausea or vomiting. 05/04/15   Jola Schmidt, MD  oxyCODONE-acetaminophen (PERCOCET) 5-325 MG tablet Take 2 tablets by mouth every 4 (four) hours as needed (for pain). 06/23/15   John Molpus, MD  pantoprazole (PROTONIX) 20 MG tablet Take 20  mg by mouth daily.    Historical Provider, MD  pantoprazole (PROTONIX) 40 MG tablet Take 40 mg by mouth daily.    Historical Provider, MD  promethazine (PHENERGAN) 25 MG suppository Place 1 suppository (25 mg total) rectally every 6 (six) hours as needed for nausea or vomiting. 06/23/15   John Molpus, MD  sucralfate (CARAFATE) 1 G tablet Take 1 tablet (1 g total) by mouth 4 (four) times daily -  with meals and at bedtime. 11/02/13   Dorie Rank, MD  traMADol (ULTRAM) 50 MG tablet Take 1 tablet (50 mg total) by mouth every 6 (six) hours as needed. 09/08/14   Orpah Greek, MD   BP 167/111 mmHg  Pulse 102  Temp(Src) 97.9 F  (36.6 C) (Oral)  Resp 16  Ht 5\' 3"  (1.6 m)  Wt 97.523 kg  BMI 38.09 kg/m2  SpO2 100% Physical Exam  Constitutional: She is oriented to person, place, and time. She appears well-developed and well-nourished. No distress.  HENT:  Head: Normocephalic and atraumatic.  Mouth/Throat: Oropharynx is clear and moist.  Eyes: Pupils are equal, round, and reactive to light.  Neck: Normal range of motion. Neck supple.  Cardiovascular: Normal rate, regular rhythm and normal heart sounds.  Exam reveals no gallop and no friction rub.   No murmur heard. Pulmonary/Chest: Effort normal and breath sounds normal. No respiratory distress. She has no wheezes.  Abdominal: Soft. Bowel sounds are normal. She exhibits no distension. There is no tenderness.  Neurological: She is alert and oriented to person, place, and time. She has normal strength and normal reflexes. No sensory deficit. She exhibits normal muscle tone. Coordination and gait normal. GCS eye subscore is 4. GCS verbal subscore is 5. GCS motor subscore is 6.  Skin: Skin is warm and dry. No rash noted. No erythema.  Psychiatric: She has a normal mood and affect. Her behavior is normal.  Nursing note and vitals reviewed.   ED Course  Procedures (including critical care time) Labs Review Labs Reviewed - No data to display  Imaging Review Ct Head Wo Contrast  07/13/2015  CLINICAL DATA:  Headache for 3 days.  Abnormal MRI brain 06/29/2015. EXAM: CT HEAD WITHOUT CONTRAST TECHNIQUE: Contiguous axial images were obtained from the base of the skull through the vertex without intravenous contrast. COMPARISON:  CT head without contrast 06/24/2015. MRI brain 06/29/2015. FINDINGS: No acute cortical infarct, hemorrhage, or mass lesion is present. The ventricles are of normal size. The basal ganglia and insular ribbon is intact bilaterally. The suggested punctate left frontal lobe infarct is not evident on CT. The paranasal sinuses and the mastoid air cells are  clear. Atherosclerotic calcifications are again noted in the cavernous internal carotid arteries bilaterally. Bilateral lens replacements are present. No significant extracranial soft tissue lesions are evident. IMPRESSION: 1. Normal CT appearance of the brain. 2. 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 Electronically Signed   By: San Morelle M.D.   On: 07/13/2015 15:24   I have personally reviewed and evaluated these images and lab results as part of my medical decision-making.    Patient be discharged home.  She is feeling better following IV medications.  Told to return here as needed.  Also advised her to follow-up as scheduled with her workup for this small punctate infarct.  Patient does not having any neurological deficits  Dalia Heading, PA-C 07/13/15 [place], MD 07/14/15 1054

## 2015-07-13 NOTE — Discharge Instructions (Signed)
Return here as needed.  Follow-up with your primary care doctor.  Your CT scan did not show any acute abnormality

## 2015-07-13 NOTE — ED Notes (Signed)
Heaviness in her left arm today. Headache for a few days. She is alert oriented. She was seen at a hospital in [place] 3 days ago for headache.

## 2015-07-13 NOTE — ED Notes (Signed)
Pt. Reports head ache for several days now and she had taken her percocet and other meds with no relief.  Pt. Reports frontal and L side pain.  No slurred speech noted.  Pt. Has complete sentences when she speaks of problems and able to tell of past history and recent history with noted symptoms.

## 2015-07-23 MED FILL — OXYCODONE/APAP 5-325: 5-325 | 8 days supply | Qty: 30 | Fill #0

## 2015-08-10 MED FILL — OXYCODONE/APAP 5-325: 5-325 | 3 days supply | Qty: 20 | Fill #0

## 2016-03-13 ENCOUNTER — Encounter (HOSPITAL_BASED_OUTPATIENT_CLINIC_OR_DEPARTMENT_OTHER): Payer: Self-pay | Admitting: *Deleted

## 2016-03-13 ENCOUNTER — Emergency Department (HOSPITAL_BASED_OUTPATIENT_CLINIC_OR_DEPARTMENT_OTHER)
Admission: EM | Admit: 2016-03-13 | Discharge: 2016-03-13 | Disposition: A | Discharge status: Home / Self Care | Payer: Medicare Other | Attending: Emergency Medicine | Admitting: Emergency Medicine

## 2016-03-13 DIAGNOSIS — Z79899 Other long term (current) drug therapy: Secondary | ICD-10-CM | POA: Diagnosis not present

## 2016-03-13 DIAGNOSIS — Z85038 Personal history of other malignant neoplasm of large intestine: Secondary | ICD-10-CM | POA: Insufficient documentation

## 2016-03-13 DIAGNOSIS — I10 Essential (primary) hypertension: Secondary | ICD-10-CM | POA: Insufficient documentation

## 2016-03-13 DIAGNOSIS — G43C0 Periodic headache syndromes in child or adult, not intractable: Secondary | ICD-10-CM | POA: Diagnosis not present

## 2016-03-13 DIAGNOSIS — R51 Headache: Secondary | ICD-10-CM | POA: Diagnosis present

## 2016-03-13 MED ORDER — DEXAMETHASONE SODIUM PHOSPHATE 10 MG/ML IJ SOLN
10.0000 mg | Freq: Once | INTRAMUSCULAR | Status: AC
  Administered 2016-03-13: 10 mg via INTRAVENOUS
  Filled 2016-03-13: qty 1

## 2016-03-13 MED ORDER — HALOPERIDOL LACTATE 5 MG/ML IJ SOLN
5.0000 mg | Freq: Once | INTRAMUSCULAR | Status: AC
  Administered 2016-03-13: 5 mg via INTRAMUSCULAR
  Filled 2016-03-13: qty 1

## 2016-03-13 MED ORDER — SODIUM CHLORIDE 0.9 % IV BOLUS (SEPSIS)
500.0000 mL | Freq: Once | INTRAVENOUS | Status: AC
  Administered 2016-03-13: 500 mL via INTRAVENOUS

## 2016-03-13 MED ORDER — KETOROLAC TROMETHAMINE 30 MG/ML IJ SOLN
15.0000 mg | Freq: Once | INTRAMUSCULAR | Status: AC
  Administered 2016-03-13: 15 mg via INTRAVENOUS
  Filled 2016-03-13: qty 1

## 2016-03-13 MED ORDER — ACETAMINOPHEN 500 MG PO TABS
1000.0000 mg | ORAL_TABLET | Freq: Once | ORAL | Status: AC
  Administered 2016-03-13: 1000 mg via ORAL
  Filled 2016-03-13: qty 2

## 2016-03-13 MED ORDER — DIPHENHYDRAMINE HCL 50 MG/ML IJ SOLN
25.0000 mg | Freq: Once | INTRAMUSCULAR | Status: AC
  Administered 2016-03-13: 25 mg via INTRAVENOUS
  Filled 2016-03-13: qty 1

## 2016-03-13 MED ORDER — METOCLOPRAMIDE HCL 5 MG/ML IJ SOLN
10.0000 mg | Freq: Once | INTRAMUSCULAR | Status: AC
  Administered 2016-03-13: 10 mg via INTRAVENOUS
  Filled 2016-03-13: qty 2

## 2016-03-13 NOTE — ED Provider Notes (Signed)
[facility] Provider Note   CSN: [id number] Arrival date & time: 03/13/16  1032     History   Chief Complaint Chief Complaint  Patient presents with  . Headache    HPI Victoria Sullivan is a 61 y.o. female.  The history is provided by the patient.  Headache   This is a recurrent problem. Episode onset: 4 days ago. The problem occurs constantly. The problem has been gradually worsening. The headache is associated with bright light. The pain is located in the bilateral and frontal region. The pain is moderate. The pain does not radiate. Associated symptoms include nausea. Pertinent negatives include no vomiting.    Past Medical History:  Diagnosis Date  . Anemia   . Cancer ([place])   . Colon cancer ([place])   . GERD (gastroesophageal reflux disease)   . Hypertension   . Migraine   . Tension headache     There are no active problems to display for this patient.   Past Surgical History:  Procedure Laterality Date  . ABDOMINAL HYSTERECTOMY    . CHOLECYSTECTOMY    . COLON SURGERY      OB History    No data available       Home Medications    Prior to Admission medications   Medication Sig Start Date End Date Taking? Authorizing Provider  AmLODIPine Besylate (NORVASC PO) Take by mouth.   Yes Historical Provider, MD  captopril (CAPOTEN) 25 MG tablet Take 25 mg by mouth 3 (three) times daily.   Yes Historical Provider, MD  cloNIDine (CATAPRES) 0.2 MG tablet Take 0.2 mg by mouth 2 (two) times daily.   Yes Historical Provider, MD  ferrous sulfate 325 (65 FE) MG tablet Take 325 mg by mouth 3 (three) times daily.    Yes Historical Provider, MD  furosemide (LASIX) 20 MG tablet Take 20 mg by mouth.   Yes Historical Provider, MD  metoprolol (LOPRESSOR) 50 MG tablet Take 50 mg by mouth 2 (two) times daily.   Yes Historical Provider, MD  pantoprazole (PROTONIX) 20 MG tablet Take 20 mg by mouth daily.   Yes Historical Provider, MD  esomeprazole (NEXIUM) 40 MG capsule  Take 40 mg by mouth daily before breakfast.      Historical Provider, MD  loperamide (IMODIUM) 2 MG capsule Take 1 capsule (2 mg total) by mouth 4 (four) times daily as needed for diarrhea or loose stools. 12/03/14   Merryl Hacker, MD  ondansetron (ZOFRAN ODT) 8 MG disintegrating tablet Take 1 tablet (8 mg total) by mouth every 8 (eight) hours as needed for nausea or vomiting. 05/04/15   Jola Schmidt, MD  oxyCODONE-acetaminophen (PERCOCET) 5-325 MG tablet Take 2 tablets by mouth every 4 (four) hours as needed (for pain). 06/23/15   John Molpus, MD  pantoprazole (PROTONIX) 40 MG tablet Take 40 mg by mouth daily.    Historical Provider, MD  promethazine (PHENERGAN) 25 MG suppository Place 1 suppository (25 mg total) rectally every 6 (six) hours as needed for nausea or vomiting. 06/23/15   John Molpus, MD  sucralfate (CARAFATE) 1 G tablet Take 1 tablet (1 g total) by mouth 4 (four) times daily -  with meals and at bedtime. 11/02/13   Dorie Rank, MD  traMADol (ULTRAM) 50 MG tablet Take 1 tablet (50 mg total) by mouth every 6 (six) hours as needed. 09/08/14   Orpah Greek, MD    Family History No family history on file.  Social History Social History  Substance Use Topics  . Smoking status: Never Smoker  . Smokeless tobacco: Never Used  . Alcohol use No     Allergies   Nsaids and Morphine and related   Review of Systems Review of Systems  Gastrointestinal: Positive for nausea. Negative for vomiting.  Neurological: Positive for headaches.  All other systems reviewed and are negative.    Physical Exam Updated Vital Signs BP (!) 193/111   Pulse 102   Temp 98.7 F (37.1 C) (Oral)   Resp 20   Ht 5\' 3"  (1.6 m)   Wt 220 lb (99.8 kg)   SpO2 100%   BMI 38.97 kg/m   Physical Exam  Constitutional: She is oriented to person, place, and time. She appears well-developed and well-nourished. No distress.  HENT:  Head: Normocephalic and atraumatic.  Nose: Nose normal.  Eyes:  Conjunctivae are normal.  Neck: Neck supple. No tracheal deviation present.  Cardiovascular: Normal rate, regular rhythm and normal heart sounds.   Pulmonary/Chest: Effort normal and breath sounds normal. No respiratory distress.  Abdominal: Soft. She exhibits no distension.  Musculoskeletal: Normal range of motion.  Neurological: She is alert and oriented to person, place, and time. No cranial nerve deficit. Coordination normal.  Normal finger to nose testing and rapid alternating movement   Skin: Skin is warm and dry. Capillary refill takes less than 2 seconds.  Psychiatric: She has a normal mood and affect.  Vitals reviewed.    ED Treatments / Results  Labs (all labs ordered are listed, but only abnormal results are displayed) Labs Reviewed - No data to display  EKG  EKG Interpretation None       Radiology No results found.  Procedures Procedures (including critical care time)  Procedure note: Ultrasound Guided Peripheral IV Ultrasound guided peripheral 1.88 inch angiocath IV placement performed by me. Indications: Nursing unable to place IV. Details: The antecubital fossa and upper arm were evaluated with a multifrequency linear probe. Patent brachial veins were noted. 1 attempt was made to cannulate a vein under realtime US guidance with successful cannulation of the vein and catheter placement. There is return of non-pulsatile dark red blood. The patient tolerated the procedure well without complications. Images archived electronically.  CPT codes: [phone] and [phone]   Medications Ordered in ED Medications  metoCLOPramide (REGLAN) injection 10 mg (not administered)  diphenhydrAMINE (BENADRYL) injection 25 mg (not administered)  dexamethasone (DECADRON) injection 10 mg (not administered)  ketorolac (TORADOL) 30 MG/ML injection 15 mg (not administered)  sodium chloride 0.9 % bolus 500 mL (not administered)     Initial Impression / Assessment and Plan / ED Course  I  have reviewed the triage vital signs and the nursing notes.  Pertinent labs & imaging results that were available during my care of the patient were reviewed by me and considered in my medical decision making (see chart for details).  Clinical Course     61 y.o. female presents with migraine headache that is similar to prior starting 4 days ago. Not responding to supportive care measures at home. No neurologic deficits here and otherwise well appearing despite discomfort. No red flag symptoms for headache, no signs or symptoms of spontaneous ICH. Patient asking for dilaudid which I explained is an inappropriate agent that may worsen her condition. Provided migraine cocktail with moderate relief of symptoms but still has some refractory headache. IM haldol resolved her symptoms after sleeping. Plan to follow up with PCP as needed and return precautions discussed for worsening or new  concerning symptoms.   Final Clinical Impressions(s) / ED Diagnoses   Final diagnoses:  Periodic headache syndrome, not intractable    New Prescriptions New Prescriptions   No medications on file     Leo Grosser, MD 03/13/16 1742

## 2016-03-13 NOTE — ED Triage Notes (Signed)
Patient states she developed a headache 4 days ago.  States the pain has progressively gotten worse and now is associated with nausea. History of same.

## 2016-10-15 IMAGING — CT CT HEAD W/O CM
1 of 2 series · 16 of 30 positions shown, 20 images · non-contrast
Comparison: 06/16/2013

CLINICAL DATA: Acute headache for 3 weeks.  History of migraines.

EXAM:
CT HEAD WITHOUT CONTRAST
TECHNIQUE: Contiguous axial images were obtained from the base of the skull
through the vertex without contrast.

[Series 2: head 4.8 h37s · axial · 0.45mm/px · z∈[+1175,+1315]mm · 16 of 32 slices shown, 20 images]
[im 2/32  brain]
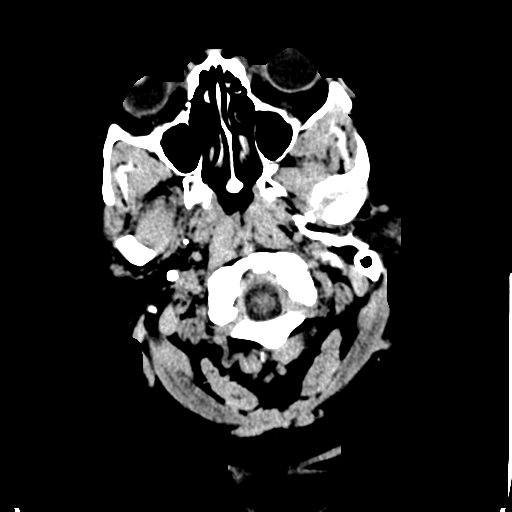
[im 2/32  bone]
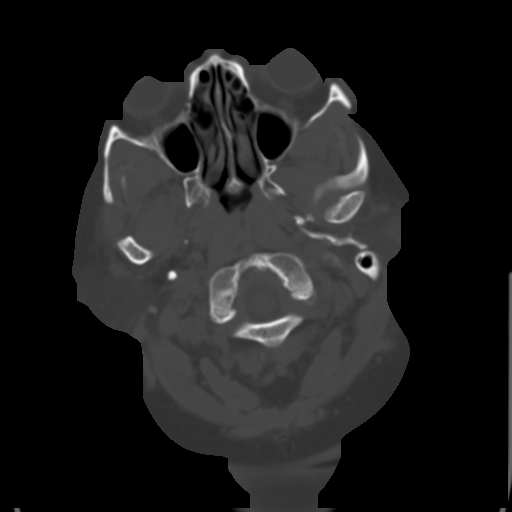
[im 3/32  brain]
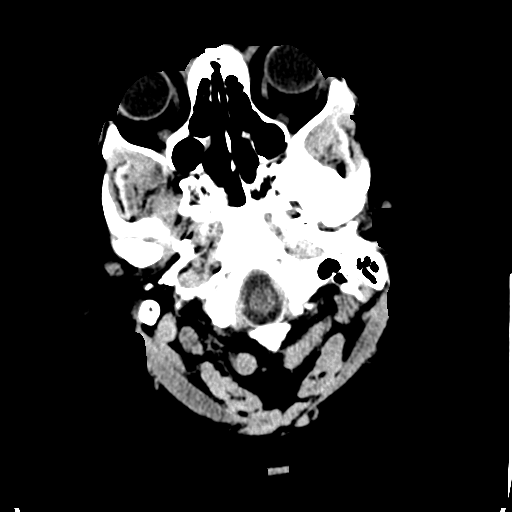
[im 6/32  brain]
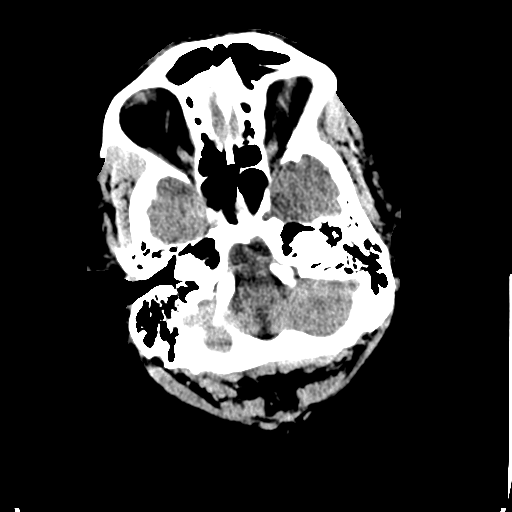
[im 8/32  brain]
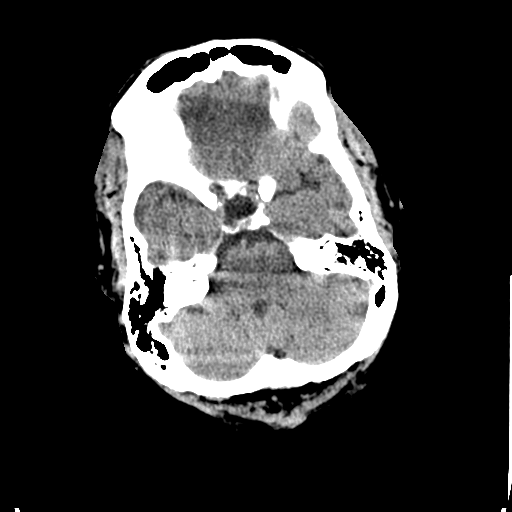
[im 9/32  brain]
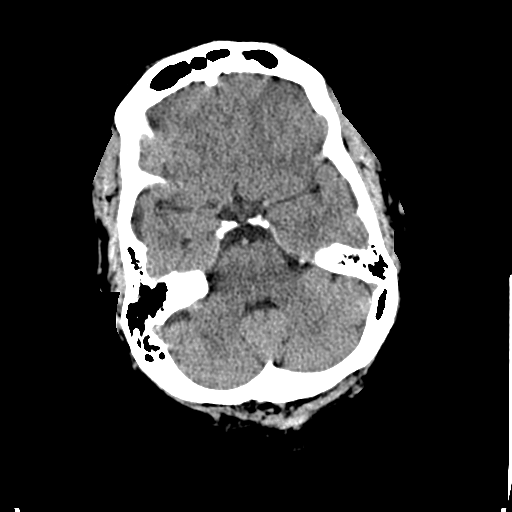
[im 9/32  bone]
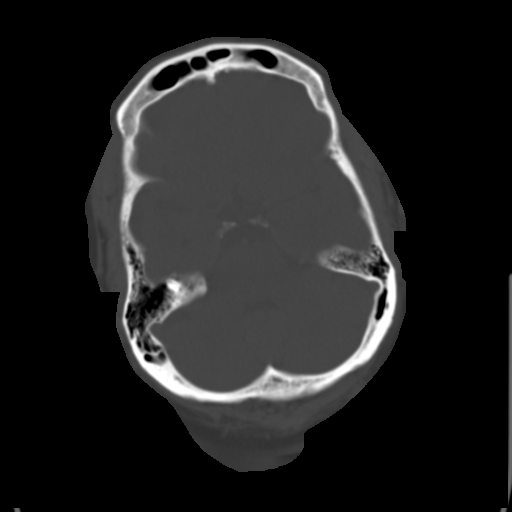
[im 11/32  brain]
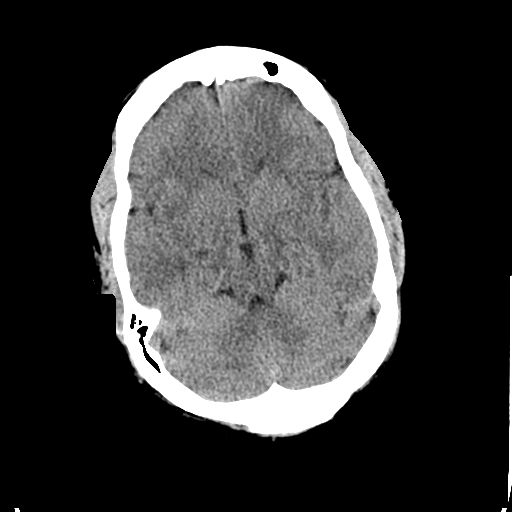
[im 14/32  brain]
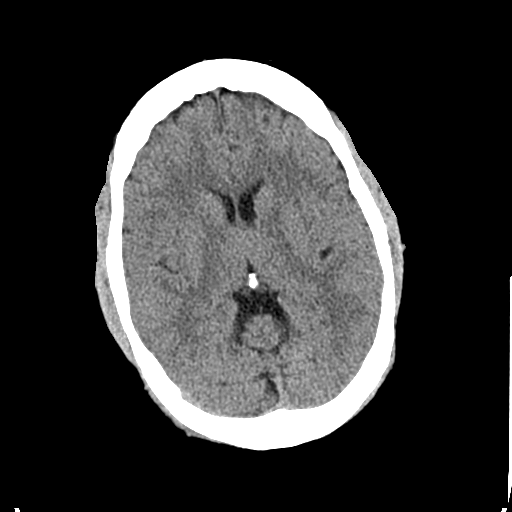
[im 15/32  brain]
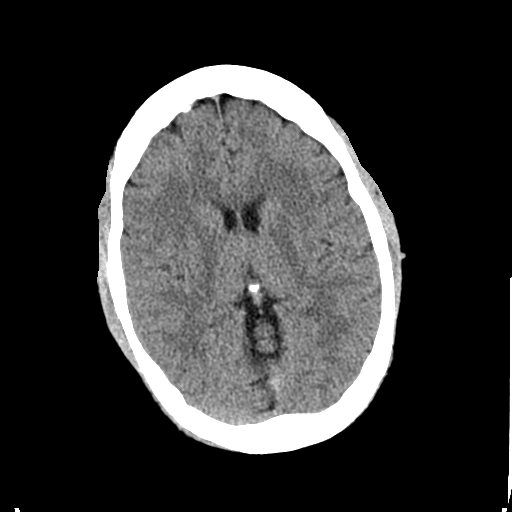
[im 17/32  brain]
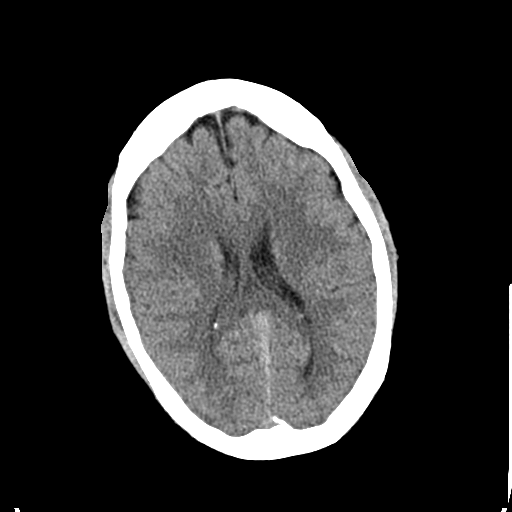
[im 17/32  bone]
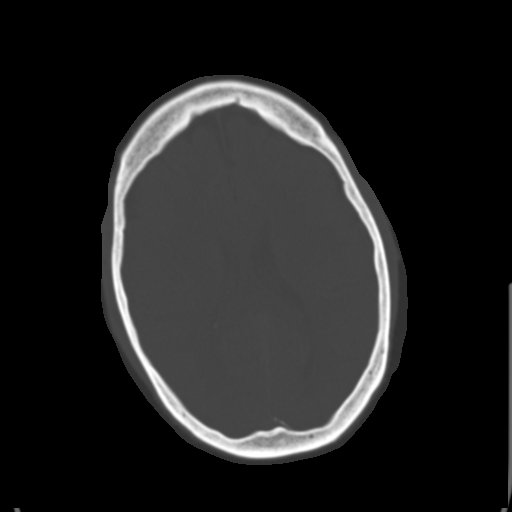
[im 18/32  brain]
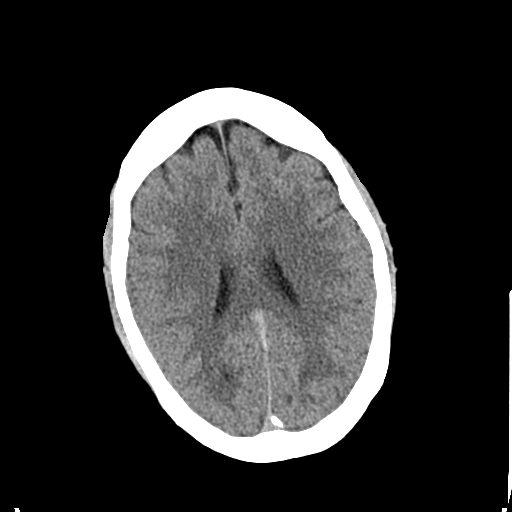
[im 21/32  brain]
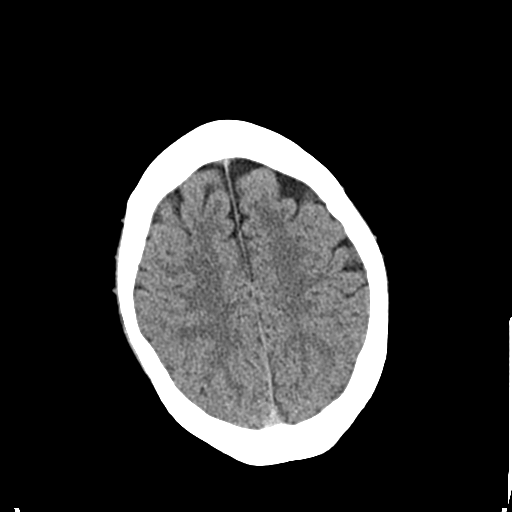
[im 23/32  brain]
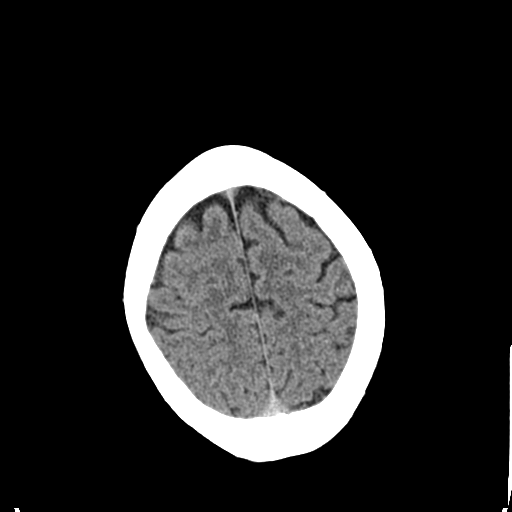
[im 24/32  brain]
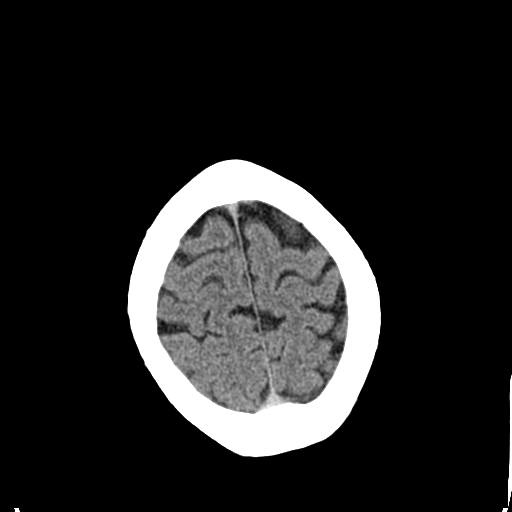
[im 24/32  bone]
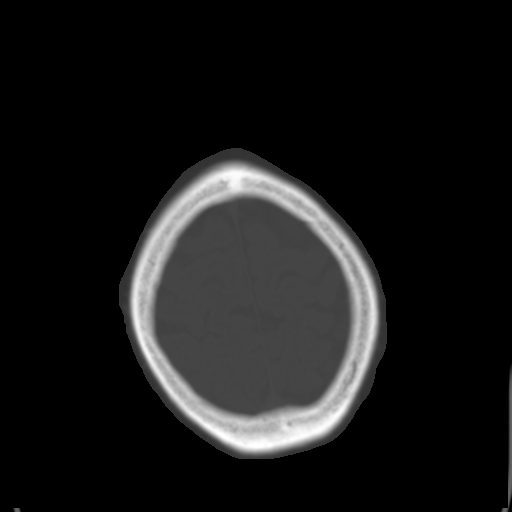
[im 26/32  brain]
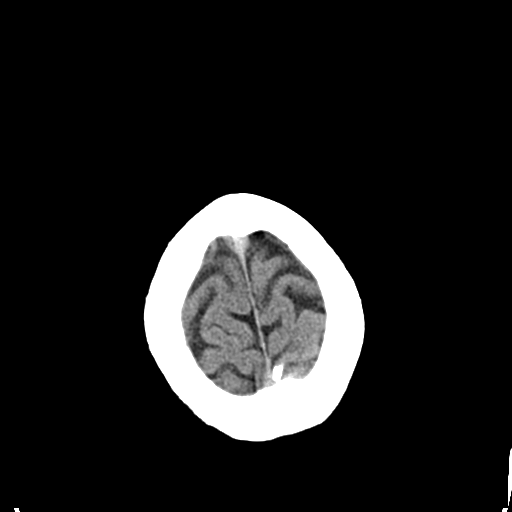
[im 29/32  brain]
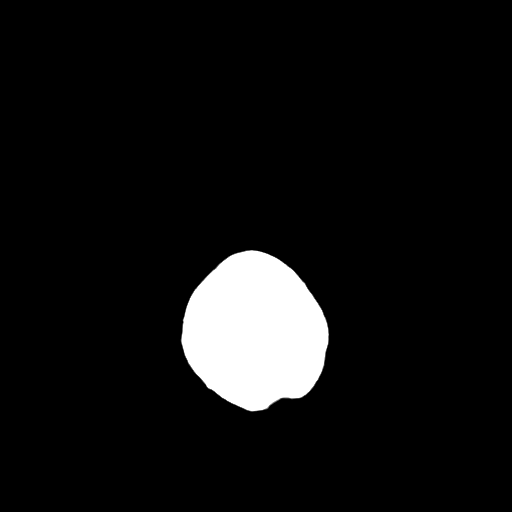
[im 30/32  brain]
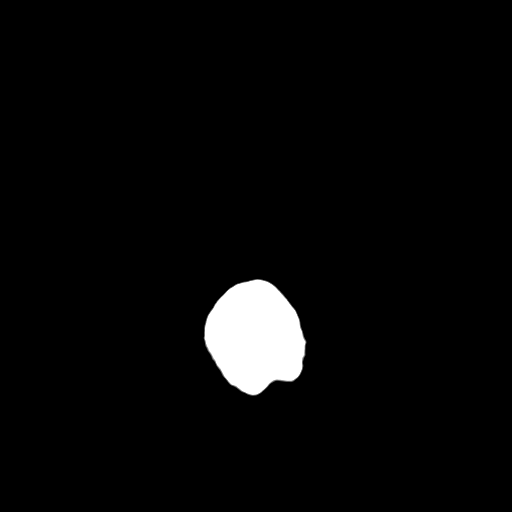

[16 of 30 positions shown; findings below may reference images not displayed]

FINDINGS: Normal appearance of the intracranial structures. No evidence for
acute hemorrhage, mass lesion, midline shift, hydrocephalus or large
infarct. No acute bony abnormality. The visualized sinuses are
clear.
IMPRESSION: No acute intracranial abnormality.

## 2017-02-26 ENCOUNTER — Other Ambulatory Visit: Payer: Self-pay

## 2017-02-26 ENCOUNTER — Encounter (HOSPITAL_BASED_OUTPATIENT_CLINIC_OR_DEPARTMENT_OTHER): Payer: Self-pay | Admitting: *Deleted

## 2017-02-26 ENCOUNTER — Emergency Department (HOSPITAL_BASED_OUTPATIENT_CLINIC_OR_DEPARTMENT_OTHER)
Admission: EM | Admit: 2017-02-26 | Discharge: 2017-02-26 | Disposition: A | Discharge status: Home / Self Care | Payer: Medicare Other | Attending: Emergency Medicine | Admitting: Emergency Medicine

## 2017-02-26 DIAGNOSIS — R51 Headache: Secondary | ICD-10-CM | POA: Diagnosis present

## 2017-02-26 DIAGNOSIS — I10 Essential (primary) hypertension: Secondary | ICD-10-CM | POA: Diagnosis not present

## 2017-02-26 DIAGNOSIS — G43009 Migraine without aura, not intractable, without status migrainosus: Secondary | ICD-10-CM | POA: Diagnosis not present

## 2017-02-26 DIAGNOSIS — Z85038 Personal history of other malignant neoplasm of large intestine: Secondary | ICD-10-CM | POA: Insufficient documentation

## 2017-02-26 DIAGNOSIS — Z79899 Other long term (current) drug therapy: Secondary | ICD-10-CM | POA: Diagnosis not present

## 2017-02-26 MED ORDER — PROMETHAZINE HCL 25 MG RE SUPP: 25.0000 mg | Freq: Four times a day (QID) | RECTAL | 0 refills | Status: AC | PRN

## 2017-02-26 MED ORDER — HYDROMORPHONE HCL 1 MG/ML IJ SOLN
1.0000 mg | Freq: Once | INTRAMUSCULAR | Status: AC
  Administered 2017-02-26: 1 mg via INTRAMUSCULAR
  Filled 2017-02-26: qty 1

## 2017-02-26 MED ORDER — PROMETHAZINE HCL 25 MG/ML IJ SOLN
25.0000 mg | Freq: Once | INTRAMUSCULAR | Status: AC
  Administered 2017-02-26: 25 mg via INTRAMUSCULAR
  Filled 2017-02-26: qty 1

## 2017-02-26 MED ORDER — PROMETHAZINE HCL 25 MG PO TABS: 25.0000 mg | ORAL_TABLET | Freq: Four times a day (QID) | ORAL | 0 refills | Status: AC | PRN

## 2017-02-26 NOTE — ED Provider Notes (Signed)
[facility] EMERGENCY DEPARTMENT Provider Note   CSN: [id number] Arrival date & time: 02/26/17  0406     History   Chief Complaint Chief Complaint  Patient presents with  . Migraine    HPI Victoria Sullivan is a 61 y.o. female.  The history is provided by the patient.  She has a history of hypertension, colon cancer, migraine headaches.  She had onset approximately 36 hours ago of severe left hemicranial headache typical of her migraines.  Pain is described as throbbing and she rates it at 10/10.  There is associated photophobia and phonophobia.  She denies fever, chills, sweats.  She denies visual disturbance.  There has been associated nausea and vomiting.  She denies weakness, numbness, tingling.  She took Fioricet without relief.  Past Medical History:  Diagnosis Date  . Anemia   . Cancer ([place])   . Colon cancer ([place])   . GERD (gastroesophageal reflux disease)   . Hypertension   . Migraine   . Tension headache     There are no active problems to display for this patient.   Past Surgical History:  Procedure Laterality Date  . ABDOMINAL HYSTERECTOMY    . CHOLECYSTECTOMY    . COLON SURGERY      OB History    No data available       Home Medications    Prior to Admission medications   Medication Sig Start Date End Date Taking? Authorizing Provider  AmLODIPine Besylate (NORVASC PO) Take by mouth.    [provider]  captopril (CAPOTEN) 25 MG tablet Take 25 mg by mouth 3 (three) times daily.    [provider]  cloNIDine (CATAPRES) 0.2 MG tablet Take 0.2 mg by mouth 2 (two) times daily.    [provider]  esomeprazole (NEXIUM) 40 MG capsule Take 40 mg by mouth daily before breakfast.      [provider]  ferrous sulfate 325 (65 FE) MG tablet Take 325 mg by mouth 3 (three) times daily.     [provider]  furosemide (LASIX) 20 MG tablet Take 20 mg by mouth.    [provider]  loperamide (IMODIUM)  2 MG capsule Take 1 capsule (2 mg total) by mouth 4 (four) times daily as needed for diarrhea or loose stools. 12/03/14   Horton, Barbette Hair, MD  metoprolol (LOPRESSOR) 50 MG tablet Take 50 mg by mouth 2 (two) times daily.    [provider]  ondansetron (ZOFRAN ODT) 8 MG disintegrating tablet Take 1 tablet (8 mg total) by mouth every 8 (eight) hours as needed for nausea or vomiting. 05/04/15   Jola Schmidt, MD  oxyCODONE-acetaminophen (PERCOCET) 5-325 MG tablet Take 2 tablets by mouth every 4 (four) hours as needed (for pain). 06/23/15   Molpus, John, MD  pantoprazole (PROTONIX) 20 MG tablet Take 20 mg by mouth daily.    [provider]  pantoprazole (PROTONIX) 40 MG tablet Take 40 mg by mouth daily.    [provider]  promethazine (PHENERGAN) 25 MG suppository Place 1 suppository (25 mg total) rectally every 6 (six) hours as needed for nausea or vomiting. 06/23/15   Molpus, John, MD  sucralfate (CARAFATE) 1 G tablet Take 1 tablet (1 g total) by mouth 4 (four) times daily -  with meals and at bedtime. 11/02/13   Dorie Rank, MD  traMADol (ULTRAM) 50 MG tablet Take 1 tablet (50 mg total) by mouth every 6 (six) hours as needed. 09/08/14  Orpah Greek, MD    Family History No family history on file.  Social History Social History   Tobacco Use  . Smoking status: Never Smoker  . Smokeless tobacco: Never Used  Substance Use Topics  . Alcohol use: No  . Drug use: No     Allergies   Nsaids and Morphine and related   Review of Systems Review of Systems  All other systems reviewed and are negative.    Physical Exam Updated Vital Signs BP (!) 154/84   Pulse 92   Temp 98 F (36.7 C)   Resp 18   Ht 5\' 3"  (1.6 m)   Wt 104.3 kg (230 lb)   SpO2 100%   BMI 40.74 kg/m   Physical Exam  Nursing note and vitals reviewed.  61 year old female, resting comfortably and in no acute distress. Vital signs are significant for hypertension. Oxygen saturation is  100%, which is normal. Head is normocephalic and atraumatic. PERRLA, EOMI. Oropharynx is clear.  Fundi show no hemorrhage, exudate, or papilledema.  There is tenderness to palpation over the left temporalis muscle. Neck has tenderness at the insertion of the left paracervical muscles, but no midline tenderness.  Neck is supple without adenopathy or JVD. Back is nontender and there is no CVA tenderness. Lungs are clear without rales, wheezes, or rhonchi. Chest is nontender. Heart has regular rate and rhythm without murmur. Abdomen is soft, flat, nontender without masses or hepatosplenomegaly and peristalsis is normoactive. Extremities: Right above-the-knee amputation.  1+ pretibial edema on the left. Skin is warm and dry without rash. Neurologic: Mental status is normal, cranial nerves are intact, there are no motor or sensory deficits.  ED Treatments / Results   Procedures Procedures (including critical care time)  Medications Ordered in ED Medications - No data to display   Initial Impression / Assessment and Plan / ED Course  I have reviewed the triage vital signs and the nursing notes.  Headache which has characteristics suggestive of migraine headache, physical findings suggestive of muscle contraction headache.  Old records are reviewed, and she does have multiple prior ED visits for migraine headaches.  I discussed with patient giving her a migraine cocktail.  However, she states that she is a very difficult stick and will prefer intramuscular medication.  She is requesting specifically intramuscular promethazine and hydromorphone.  On reviewing prior ED visits for headaches, it is noted that she usually does get a dose of a narcotic painkiller.  She is given intramuscular promethazine and hydromorphone with excellent relief of headache.  She is requesting prescription for something for nausea at home and also something for pain.  I have reviewed her record on the [place]  controlled substance reporting website and it is noted that she gets 120 oxycodone-acetaminophen 10-325 tablets every month with last prescription filled December 1.  I have advised the patient that if she has run out of this, she is not taking it as prescribed as it should last a whole month.  If she needs additional narcotic pain medication, she needs to discuss that with the provider who is prescribing the oxycodone-acetaminophen.  Patient expresses understanding.  She is given prescription for promethazine tablets and promethazine suppositories.  Final Clinical Impressions(s) / ED Diagnoses   Final diagnoses:  Migraine without aura and without status migrainosus, not intractable    ED Discharge Orders        Ordered    promethazine (PHENERGAN) 25 MG suppository  Every 6 hours PRN  02/26/17 0524    promethazine (PHENERGAN) 25 MG tablet  Every 6 hours PRN     95/09/32 6712       Delora Fuel, MD 45/80/99 [phone]

## 2017-02-26 NOTE — ED Triage Notes (Signed)
C/o  Headache to the left side of her head that started on Friday but getting worse. Describes as throbbing.  States light makes pain worse. Hx of migraines. C/o nausea.

## 2017-02-26 NOTE — ED Notes (Signed)
MD with pt
